# Patient Record
Sex: Female | Born: 1955 | Race: White | Hispanic: No | Marital: Single | Smoking: Never smoker
Health system: Southern US, Community
[De-identification: ages and names within clinical notes are randomized; demographics above are authoritative.]

## PROBLEM LIST (undated history)

## (undated) DIAGNOSIS — E785 Hyperlipidemia, unspecified: Secondary | ICD-10-CM

## (undated) DIAGNOSIS — Z79890 Hormone replacement therapy (postmenopausal): Secondary | ICD-10-CM

## (undated) DIAGNOSIS — F419 Anxiety disorder, unspecified: Secondary | ICD-10-CM

## (undated) DIAGNOSIS — B9681 Helicobacter pylori [H. pylori] as the cause of diseases classified elsewhere: Secondary | ICD-10-CM

## (undated) DIAGNOSIS — M8580 Other specified disorders of bone density and structure, unspecified site: Secondary | ICD-10-CM

## (undated) DIAGNOSIS — E2839 Other primary ovarian failure: Secondary | ICD-10-CM

## (undated) DIAGNOSIS — E039 Hypothyroidism, unspecified: Secondary | ICD-10-CM

## (undated) DIAGNOSIS — G479 Sleep disorder, unspecified: Secondary | ICD-10-CM

## (undated) DIAGNOSIS — G894 Chronic pain syndrome: Secondary | ICD-10-CM

## (undated) DIAGNOSIS — C50919 Malignant neoplasm of unspecified site of unspecified female breast: Secondary | ICD-10-CM

## (undated) DIAGNOSIS — K219 Gastro-esophageal reflux disease without esophagitis: Secondary | ICD-10-CM

## (undated) DIAGNOSIS — K5790 Diverticulosis of intestine, part unspecified, without perforation or abscess without bleeding: Secondary | ICD-10-CM

## (undated) DIAGNOSIS — Z973 Presence of spectacles and contact lenses: Secondary | ICD-10-CM

## (undated) DIAGNOSIS — K279 Peptic ulcer, site unspecified, unspecified as acute or chronic, without hemorrhage or perforation: Secondary | ICD-10-CM

## (undated) DIAGNOSIS — M1990 Unspecified osteoarthritis, unspecified site: Secondary | ICD-10-CM

## (undated) DIAGNOSIS — I10 Essential (primary) hypertension: Secondary | ICD-10-CM

## (undated) DIAGNOSIS — Z923 Personal history of irradiation: Secondary | ICD-10-CM

## (undated) DIAGNOSIS — F329 Major depressive disorder, single episode, unspecified: Secondary | ICD-10-CM

## (undated) DIAGNOSIS — T7840XA Allergy, unspecified, initial encounter: Secondary | ICD-10-CM

## (undated) DIAGNOSIS — F32A Depression, unspecified: Secondary | ICD-10-CM

## (undated) DIAGNOSIS — R413 Other amnesia: Secondary | ICD-10-CM

## (undated) HISTORY — DX: Chronic pain syndrome: G89.4

## (undated) HISTORY — PX: URETHRAL DILATION: SUR417

## (undated) HISTORY — PX: JOINT REPLACEMENT: SHX530

## (undated) HISTORY — DX: Gastro-esophageal reflux disease without esophagitis: K21.9

## (undated) HISTORY — DX: Sleep disorder, unspecified: G47.9

## (undated) HISTORY — DX: Helicobacter pylori (H. pylori) as the cause of diseases classified elsewhere: B96.81

## (undated) HISTORY — DX: Other specified disorders of bone density and structure, unspecified site: M85.80

## (undated) HISTORY — DX: Allergy, unspecified, initial encounter: T78.40XA

## (undated) HISTORY — DX: Hyperlipidemia, unspecified: E78.5

## (undated) HISTORY — PX: WISDOM TOOTH EXTRACTION: SHX21

## (undated) HISTORY — DX: Hormone replacement therapy: Z79.890

## (undated) HISTORY — DX: Other amnesia: R41.3

## (undated) HISTORY — PX: COLONOSCOPY: SHX174

## (undated) HISTORY — DX: Other primary ovarian failure: E28.39

## (undated) HISTORY — DX: Diverticulosis of intestine, part unspecified, without perforation or abscess without bleeding: K57.90

## (undated) HISTORY — DX: Helicobacter pylori (H. pylori) as the cause of diseases classified elsewhere: K27.9

---

## 1997-10-22 ENCOUNTER — Ambulatory Visit (HOSPITAL_COMMUNITY): Admission: RE | Admit: 1997-10-22 | Discharge: 1997-10-22 | Payer: Self-pay | Admitting: Obstetrics and Gynecology

## 1997-10-22 ENCOUNTER — Other Ambulatory Visit: Admission: RE | Admit: 1997-10-22 | Discharge: 1997-10-22 | Payer: Self-pay | Admitting: Obstetrics & Gynecology

## 2004-07-20 ENCOUNTER — Other Ambulatory Visit: Admission: RE | Admit: 2004-07-20 | Discharge: 2004-07-20 | Payer: Self-pay | Admitting: Family Medicine

## 2006-02-16 ENCOUNTER — Other Ambulatory Visit: Admission: RE | Admit: 2006-02-16 | Discharge: 2006-02-16 | Payer: Self-pay | Admitting: Family Medicine

## 2007-02-21 ENCOUNTER — Other Ambulatory Visit: Admission: RE | Admit: 2007-02-21 | Discharge: 2007-02-21 | Payer: Self-pay | Admitting: Family Medicine

## 2008-03-03 ENCOUNTER — Other Ambulatory Visit: Admission: RE | Admit: 2008-03-03 | Discharge: 2008-03-03 | Payer: Self-pay | Admitting: Obstetrics and Gynecology

## 2014-01-23 DIAGNOSIS — C50919 Malignant neoplasm of unspecified site of unspecified female breast: Secondary | ICD-10-CM

## 2014-01-23 HISTORY — DX: Malignant neoplasm of unspecified site of unspecified female breast: C50.919

## 2014-01-24 ENCOUNTER — Other Ambulatory Visit: Payer: Self-pay | Admitting: Radiology

## 2014-01-24 DIAGNOSIS — C50912 Malignant neoplasm of unspecified site of left female breast: Secondary | ICD-10-CM

## 2014-01-27 ENCOUNTER — Telehealth: Payer: Self-pay | Admitting: *Deleted

## 2014-01-27 ENCOUNTER — Ambulatory Visit
Admission: RE | Admit: 2014-01-27 | Discharge: 2014-01-27 | Disposition: A | Discharge status: Home / Self Care | Payer: BC Managed Care – PPO | Source: Ambulatory Visit | Attending: Radiology | Admitting: Radiology

## 2014-01-27 DIAGNOSIS — C50412 Malignant neoplasm of upper-outer quadrant of left female breast: Secondary | ICD-10-CM

## 2014-01-27 DIAGNOSIS — C50912 Malignant neoplasm of unspecified site of left female breast: Secondary | ICD-10-CM

## 2014-01-27 MED ORDER — GADOBENATE DIMEGLUMINE 529 MG/ML IV SOLN
15.0000 mL | Freq: Once | INTRAVENOUS | Status: AC | PRN
  Administered 2014-01-27: 15 mL via INTRAVENOUS

## 2014-01-27 NOTE — Telephone Encounter (Signed)
Left message for a return phone call to schedule patient for [facility]. Awaiting patient response. 

## 2014-01-27 NOTE — Telephone Encounter (Signed)
Received call back from patient.  Confirmed [facility] for 01/29/14 at 12N .  Instructions and contact information given.

## 2014-01-29 ENCOUNTER — Other Ambulatory Visit (INDEPENDENT_AMBULATORY_CARE_PROVIDER_SITE_OTHER): Payer: Self-pay | Admitting: General Surgery

## 2014-01-29 ENCOUNTER — Encounter: Payer: BC Managed Care – PPO | Admitting: Hematology and Oncology

## 2014-01-29 ENCOUNTER — Ambulatory Visit
Admission: RE | Admit: 2014-01-29 | Discharge: 2014-01-29 | Disposition: A | Discharge status: Home / Self Care | Payer: Managed Care, Other (non HMO) | Source: Ambulatory Visit | Attending: Radiation Oncology | Admitting: Radiation Oncology

## 2014-01-29 ENCOUNTER — Encounter: Payer: Self-pay | Admitting: Hematology and Oncology

## 2014-01-29 ENCOUNTER — Other Ambulatory Visit (HOSPITAL_BASED_OUTPATIENT_CLINIC_OR_DEPARTMENT_OTHER): Payer: BC Managed Care – PPO

## 2014-01-29 ENCOUNTER — Ambulatory Visit: Payer: BC Managed Care – PPO

## 2014-01-29 ENCOUNTER — Ambulatory Visit: Payer: BC Managed Care – PPO | Admitting: Physical Therapy

## 2014-01-29 ENCOUNTER — Ambulatory Visit (HOSPITAL_BASED_OUTPATIENT_CLINIC_OR_DEPARTMENT_OTHER): Payer: BC Managed Care – PPO | Admitting: Hematology and Oncology

## 2014-01-29 VITALS — BP 140/82 | HR 76 | Temp 98.3°F | Resp 18 | Ht 64.0 in | Wt 162.6 lb

## 2014-01-29 DIAGNOSIS — C50412 Malignant neoplasm of upper-outer quadrant of left female breast: Secondary | ICD-10-CM

## 2014-01-29 DIAGNOSIS — D0512 Intraductal carcinoma in situ of left breast: Secondary | ICD-10-CM

## 2014-01-29 DIAGNOSIS — D0590 Unspecified type of carcinoma in situ of unspecified breast: Secondary | ICD-10-CM

## 2014-01-29 DIAGNOSIS — Z170 Estrogen receptor positive status [ER+]: Secondary | ICD-10-CM

## 2014-01-29 LAB — CBC WITH DIFFERENTIAL/PLATELET
BASO%: 0.5 % (ref 0.0–2.0)
BASOS ABS: 0 10*3/uL (ref 0.0–0.1)
EOS%: 0.8 % (ref 0.0–7.0)
Eosinophils Absolute: 0.1 10*3/uL (ref 0.0–0.5)
HCT: 42.7 % (ref 34.8–46.6)
HEMOGLOBIN: 14.4 g/dL (ref 11.6–15.9)
LYMPH%: 13.9 % — ABNORMAL LOW (ref 14.0–49.7)
MCH: 33.3 pg (ref 25.1–34.0)
MCHC: 33.8 g/dL (ref 31.5–36.0)
MCV: 98.5 fL (ref 79.5–101.0)
MONO#: 0.5 10*3/uL (ref 0.1–0.9)
MONO%: 6.6 % (ref 0.0–14.0)
NEUT#: 6.5 10*3/uL (ref 1.5–6.5)
NEUT%: 78.2 % — AB (ref 38.4–76.8)
Platelets: 281 10*3/uL (ref 145–400)
RBC: 4.33 10*6/uL (ref 3.70–5.45)
RDW: 12.3 % (ref 11.2–14.5)
WBC: 8.3 10*3/uL (ref 3.9–10.3)
lymph#: 1.2 10*3/uL (ref 0.9–3.3)

## 2014-01-29 LAB — COMPREHENSIVE METABOLIC PANEL (CC13)
ALT: 46 U/L (ref 0–55)
AST: 44 U/L — AB (ref 5–34)
Albumin: 4 g/dL (ref 3.5–5.0)
Alkaline Phosphatase: 70 U/L (ref 40–150)
Anion Gap: 9 mEq/L (ref 3–11)
BUN: 6.9 mg/dL — ABNORMAL LOW (ref 7.0–26.0)
CHLORIDE: 102 meq/L (ref 98–109)
CO2: 26 mEq/L (ref 22–29)
CREATININE: 0.8 mg/dL (ref 0.6–1.1)
Calcium: 9.5 mg/dL (ref 8.4–10.4)
Glucose: 130 mg/dl (ref 70–140)
POTASSIUM: 4.3 meq/L (ref 3.5–5.1)
Sodium: 138 mEq/L (ref 136–145)
Total Bilirubin: 0.43 mg/dL (ref 0.20–1.20)
Total Protein: 7.1 g/dL (ref 6.4–8.3)

## 2014-01-29 MED ORDER — INFLUENZA VAC SPLIT QUAD 0.5 ML IM SUSY
0.5000 mL | PREFILLED_SYRINGE | Freq: Once | INTRAMUSCULAR | Status: DC
  Filled 2014-01-29: qty 0.5

## 2014-01-29 NOTE — Assessment & Plan Note (Signed)
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 The significance of ER PRreceptors was discussed and the implications for treatment in terms of antiestrogen therapy. After reviewing the pathology in detail, we proceeded to discuss the different treatment options between surgery, radiation, and antiestrogen therapies.  I would like to see the patient after surgery to discuss the final pathology report and for initiation of antiestrogen therapy. If she has any evidence of invasive cancer, we will then talk need to talk about risks and benefits of systemic chemotherapy if it was appropriate depending on the size of the tumor and lymph node status. Based on multidisciplinary tumor board discussion we determined that she does not need sentinel lymph node study is point the reassess if there was invasive cancer.

## 2014-01-29 NOTE — Progress Notes (Signed)
Checked in new patient with no financial issues prior to seeing the dr. She has appt card and [facility] packet. I gave her Lenise's card and advised of [facility] and if interested to let her know. [place] been [place] of the [place]

## 2014-01-29 NOTE — Progress Notes (Signed)
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 female who is seen today at the [facility] for the courtesy of Dr. Marlou Starks for consideration of radiation therapy in the management of her DCIS of the left breast. At the time of a screening mammogram at [facility] on 01/13/2014, she was noted to have a new cluster of heterogeneous calcifications in the left breast at 11:00. Additional views showed a 0.5 cm area of punctate calcifications in the left breast central to the nipple in the retroareolar region. Biopsy on 01/23/2014 was diagnostic for high-grade DCIS with comedonecrosis and calcifications. Breast MR on 01/27/2014 showed biopsy changes within the anterior subareolar left breast along with background enhancement limiting the evaluation of the extent of her disease. She is without complaints today.  PREVIOUS RADIATION THERAPY: No   PAST MEDICAL HISTORY:  has no past medical history on file.     PAST SURGICAL HISTORY: No past surgical history on file.   FAMILY HISTORY: family history includes Prostate cancer in her father. Her father was diagnosed with prostate cancer at age 84 and died of metastatic disease soon afterwards. Her mother is alive and has numerous orthopedic issues. No family history of breast cancer.   SOCIAL HISTORY:  reports that she has never smoked. She does not have any smokeless tobacco history on file. She reports that she drinks alcohol. She reports that she does not use illicit drugs. Single, no children. She works for [employer] in [employer].   ALLERGIES: Review of patient's allergies indicates not on file.   MEDICATIONS:  Current  Outpatient Prescriptions  Medication Sig Dispense Refill  . ALPRAZolam (XANAX) 0.5 MG tablet Take 0.5 mg by mouth at bedtime as needed for anxiety.      . Biotin 5000 MCG CAPS Take 1 capsule by mouth daily.      . celecoxib (CELEBREX) 200 MG capsule Take 200 mg by mouth 2 (two) times daily.      Marland Kitchen escitalopram (LEXAPRO) 20 MG tablet Take 20 mg by mouth daily.      . Milk Thistle 200 MG CAPS Take 100 mg by mouth 3 (three) times daily.      . nebivolol (BYSTOLIC) 10 MG tablet Take 10 mg by mouth daily.      Marland Kitchen thyroid (ARMOUR) 30 MG tablet Take 30 mg by mouth daily before breakfast.      . traMADol (ULTRAM) 50 MG tablet Take 50 mg by mouth every 6 (six) hours as needed.      . zaleplon (SONATA) 10 MG capsule Take 10 mg by mouth at bedtime as needed for sleep.       No current facility-administered medications for this encounter.   Facility-Administered Medications Ordered in Other Encounters  Medication Dose Route Frequency Provider Last Rate Last Dose  . Influenza vac split quadrivalent PF (FLUARIX) injection 0.5 mL  0.5 mL Intramuscular Once Rulon Eisenmenger, MD         REVIEW OF SYSTEMS:  Pertinent items are noted in HPI.    PHYSICAL EXAM: Alert and oriented 58 year old white female  appearing her stated age. Wt Readings from Last 3 Encounters:  01/29/14 162 lb 9.6 oz (73.755 kg)   Temp Readings from Last 3 Encounters:  01/29/14 98.3 F (36.8 C) Oral   BP Readings from Last 3 Encounters:  01/29/14 140/82   Pulse Readings from Last 3 Encounters:  01/29/14 76   Head and neck examination: Grossly unremarkable. Nodes: Without palpable cervical, supraclavicular, or axillary lymphadenopathy. Chest: Lungs clear. Breasts: There is a punctate biopsy wound at approximately 11:00 along the upper inner quadrant of the left breast. No masses are appreciated. Right breast without masses or lesions. Extremities without edema.    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 I explained to the patient that her local management options include mastectomy versus partial mastectomy followed by radiation therapy. We discussed disease-free survival and overall survival for mastectomy and breast conservation. She appears to be a candidate for breast preservation. We discussed the potential acute and late toxicities of radiation therapy. Her prognosis appears to be excellent. I can see her for a followup visit following her definitive surgery. I would obtain a baseline right breast mammogram prior to initiation of her radiation therapy to insure removal of all suspicious microcalcifications.   PLAN: As discussed above.  I spent 30 minutes minutes face to face with the patient and more than 50% of that time was spent in counseling and/or coordination of care.

## 2014-01-29 NOTE — Progress Notes (Signed)
[facility] CONSULT NOTE  Patient Care Team: Candace Wyline Copas, MD as PCP - General (Family Medicine)  CHIEF COMPLAINTS/PURPOSE OF CONSULTATION:  Newly diagnosed DCIS left breast  HISTORY OF PRESENTING ILLNESS:  Erin Barnes 58 y.o. female is here because of recent diagnosis of an left breast DCIS. Patient had a routine screening mammogram that revealed left breast calcifications that led to an ultrasound which showed only ductal ectasia. This was biopsied and it came back as high-grade DCIS ER positive PR positive with comedonecrosis. She had an MRI of the breast on 01/27/2014 that revealed a 2.1 x 1.6 x 1.6 cm abnormality some of which may be background uptake. She is here today in the multidisciplinary breast clinic to discuss the treatment plan. She is by herself. Denies any major problems or concerns. She was taking progesterone pills for menopausal symptoms for a while.  I reviewed her records extensively and collaborated the history with the patient.  SUMMARY OF ONCOLOGIC HISTORY:   Breast cancer of upper-outer quadrant of left female breast   01/27/2014 Initial Diagnosis High-grade DCIS with necrosis ER/PR positive   01/27/2014 Breast MRI Left breast subareolar region 1.6 x 1.6 x 2.1 cm    In terms of breast cancer risk profile:  She menarched at early age of 23 and went to menopause at age 22  She had 0 pregnancy,  She has not received birth control pills She was exposed to fertility medications or hormone replacement therapy.  She has no family history of Breast/GYN/GI cancer  MEDICAL HISTORY:  History reviewed. No pertinent past medical history.  SURGICAL HISTORY: History reviewed. No pertinent past surgical history.  SOCIAL HISTORY: History   Social History  . Marital Status: Single    Spouse Name: N/A    Number of Children: N/A  . Years of Education: N/A   Occupational History  . Not on file.   Social History Main Topics  . Smoking status:  Never Smoker   . Smokeless tobacco: Not on file  . Alcohol Use: Yes  . Drug Use: No  . Sexual Activity: Not on file   Other Topics Concern  . Not on file   Social History Narrative  . No narrative on file    FAMILY HISTORY: Family History  Problem Relation Age of Onset  . Prostate cancer Father     ALLERGIES:  has no allergies on file.  MEDICATIONS:  Current Outpatient Prescriptions  Medication Sig Dispense Refill  . ALPRAZolam (XANAX) 0.5 MG tablet Take 0.5 mg by mouth at bedtime as needed for anxiety.      . Biotin 5000 MCG CAPS Take 1 capsule by mouth daily.      . celecoxib (CELEBREX) 200 MG capsule Take 200 mg by mouth 2 (two) times daily.      Marland Kitchen escitalopram (LEXAPRO) 20 MG tablet Take 20 mg by mouth daily.      . Milk Thistle 200 MG CAPS Take 100 mg by mouth 3 (three) times daily.      . nebivolol (BYSTOLIC) 10 MG tablet Take 10 mg by mouth daily.      Marland Kitchen thyroid (ARMOUR) 30 MG tablet Take 30 mg by mouth daily before breakfast.      . traMADol (ULTRAM) 50 MG tablet Take 50 mg by mouth every 6 (six) hours as needed.      . zaleplon (SONATA) 10 MG capsule Take 10 mg by mouth at bedtime as needed for sleep.  No current facility-administered medications for this visit.    REVIEW OF SYSTEMS:   Constitutional: Denies fevers, chills or abnormal night sweats Eyes: Denies blurriness of vision, double vision or watery eyes Ears, nose, mouth, throat, and face: Denies mucositis or sore throat Respiratory: Denies cough, dyspnea or wheezes Cardiovascular: Denies palpitation, chest discomfort or lower extremity swelling Gastrointestinal:  Denies nausea, heartburn or change in bowel habits Skin: Denies abnormal skin rashes Lymphatics: Denies new lymphadenopathy or easy bruising Neurological:Denies numbness, tingling or new weaknesses Behavioral/Psych: Mood is stable, no new changes  Breast:  Denies any palpable lumps or discharge All other systems were reviewed with the  patient and are negative.  PHYSICAL EXAMINATION: ECOG PERFORMANCE STATUS: 0 - Asymptomatic  Filed Vitals:   01/29/14 1252  BP: 140/82  Pulse: 76  Temp: 98.3 F (36.8 C)  Resp: 18   Filed Weights   01/29/14 1252  Weight: 162 lb 9.6 oz (73.755 kg)    GENERAL:alert, no distress and comfortable SKIN: skin color, texture, turgor are normal, no rashes or significant lesions EYES: normal, conjunctiva are pink and non-injected, sclera clear OROPHARYNX:no exudate, no erythema and lips, buccal mucosa, and tongue normal  NECK: supple, thyroid normal size, non-tender, without nodularity LYMPH:  no palpable lymphadenopathy in the cervical, axillary or inguinal LUNGS: clear to auscultation and percussion with normal breathing effort HEART: regular rate & rhythm and no murmurs and no lower extremity edema ABDOMEN:abdomen soft, non-tender and normal bowel sounds Musculoskeletal:no cyanosis of digits and no clubbing  PSYCH: alert & oriented x 3 with fluent speech NEURO: no focal motor/sensory deficits BREAST: No palpable nodules in breast. No palpable axillary or supraclavicular lymphadenopathy  LABORATORY DATA:  I have reviewed the data as listed Lab Results  Component Value Date   WBC 8.3 01/29/2014   HGB 14.4 01/29/2014   HCT 42.7 01/29/2014   MCV 98.5 01/29/2014   PLT 281 01/29/2014   Lab Results  Component Value Date   NA 138 01/29/2014   K 4.3 01/29/2014   CO2 26 01/29/2014    RADIOGRAPHIC STUDIES: I have personally reviewed the radiological reports and agreed with the findings in the report. 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 The significance of ER PRreceptors was  discussed and the implications for treatment in terms of antiestrogen therapy. After reviewing the pathology in detail, we proceeded to discuss the different treatment options between surgery, radiation, and antiestrogen therapies.  I would like to see the patient after surgery to discuss the final pathology report and for initiation of antiestrogen therapy. If she has any evidence of invasive cancer, we will then talk need to talk about risks and benefits of systemic chemotherapy if it was appropriate depending on the size of the tumor and lymph node status. Based on multidisciplinary tumor board discussion we determined that she does not need sentinel lymph node study is point the reassess if there was invasive cancer.   return to clinic after surgery to start antiestrogen therapy and to review pathology.  All questions were answered. The patient knows to call the clinic with any problems, questions or concerns. I spent 55 minutes counseling the patient face to face. The total time spent in the  appointment was 60 minutes and more than 50% was on counseling.     Rulon Eisenmenger, MD 01/29/2014 4:02 PM   Her

## 2014-01-31 ENCOUNTER — Encounter: Payer: Self-pay | Admitting: General Practice

## 2014-01-31 NOTE — Progress Notes (Signed)
[place] Psychosocial Distress Screening Spiritual Care  With Counseling Intern, visited with Ms Alber at breast clinic to introduce [place], Counseling, and [place] resources, and to review distress screening protocol.  The patient scored a 1 on the Psychosocial Distress Thermometer which indicates mild distress. Chaplain and Counseling Intern assessed for distress and other psychosocial needs.   [facility] DISTRESS SCREENING 01/31/2014  Screening Type Initial Screening  Mark the number that describes how much distress you have been experiencing in the past week 0  Referral to support programs Yes  Other Spiritual Care, Counseling Intern   Ms Earnhardt was very upbeat, using faith as a central coping tool:  "I just know whose I am...and I'm here for a reason; my destiny's not up."  She rates her distress as "0," citing "no fear."  My only concern is if tx becomes more complicated that she expects:  How will she cope if she becomes fearful or disheartened?  Please page Spiritual Care if such emotional/spiritual needs arise during course of tx:  [phone].  Thank you.   Immediate follow up needed: No.   plans to follow up by phone later in pt's course of tx.  [place], [place]

## 2014-01-31 NOTE — Progress Notes (Signed)
Note created by MD during office visit - copy to pt, original to scan.   

## 2014-02-04 ENCOUNTER — Telehealth: Payer: Self-pay | Admitting: *Deleted

## 2014-02-04 NOTE — Telephone Encounter (Signed)
Left message for a return phone call from [facility]. Awaiting patient response.

## 2014-02-05 ENCOUNTER — Other Ambulatory Visit: Payer: Self-pay

## 2014-02-07 ENCOUNTER — Encounter (HOSPITAL_BASED_OUTPATIENT_CLINIC_OR_DEPARTMENT_OTHER): Payer: Self-pay | Admitting: *Deleted

## 2014-02-07 ENCOUNTER — Telehealth: Payer: Self-pay | Admitting: Hematology and Oncology

## 2014-02-07 NOTE — Telephone Encounter (Signed)
lvm forpt regarding to OCT appt.....amiled pt appt sched/avs and letter

## 2014-02-07 NOTE — Progress Notes (Signed)
To come in for ekg-had labs 01/29/14

## 2014-02-10 ENCOUNTER — Encounter (HOSPITAL_BASED_OUTPATIENT_CLINIC_OR_DEPARTMENT_OTHER)
Admission: RE | Admit: 2014-02-10 | Discharge: 2014-02-10 | Disposition: A | Discharge status: Home / Self Care | Payer: BC Managed Care – PPO | Source: Ambulatory Visit | Attending: General Surgery | Admitting: General Surgery

## 2014-02-10 DIAGNOSIS — E079 Disorder of thyroid, unspecified: Secondary | ICD-10-CM | POA: Diagnosis not present

## 2014-02-10 DIAGNOSIS — M129 Arthropathy, unspecified: Secondary | ICD-10-CM | POA: Diagnosis not present

## 2014-02-10 DIAGNOSIS — Z01812 Encounter for preprocedural laboratory examination: Secondary | ICD-10-CM | POA: Insufficient documentation

## 2014-02-10 DIAGNOSIS — Z01810 Encounter for preprocedural cardiovascular examination: Secondary | ICD-10-CM | POA: Diagnosis present

## 2014-02-10 DIAGNOSIS — I10 Essential (primary) hypertension: Secondary | ICD-10-CM | POA: Diagnosis not present

## 2014-02-10 DIAGNOSIS — D0590 Unspecified type of carcinoma in situ of unspecified breast: Secondary | ICD-10-CM | POA: Insufficient documentation

## 2014-02-10 DIAGNOSIS — C50919 Malignant neoplasm of unspecified site of unspecified female breast: Secondary | ICD-10-CM | POA: Diagnosis present

## 2014-02-10 NOTE — Progress Notes (Signed)
EKG reviewed by Dr Al Corpus.

## 2014-02-12 ENCOUNTER — Encounter (HOSPITAL_BASED_OUTPATIENT_CLINIC_OR_DEPARTMENT_OTHER)
Admission: RE | Disposition: A | Discharge status: Home / Self Care | Payer: Self-pay | Source: Ambulatory Visit | Attending: General Surgery

## 2014-02-12 ENCOUNTER — Ambulatory Visit (HOSPITAL_BASED_OUTPATIENT_CLINIC_OR_DEPARTMENT_OTHER): Payer: BC Managed Care – PPO | Admitting: Anesthesiology

## 2014-02-12 ENCOUNTER — Encounter (HOSPITAL_BASED_OUTPATIENT_CLINIC_OR_DEPARTMENT_OTHER): Payer: Self-pay

## 2014-02-12 ENCOUNTER — Encounter (HOSPITAL_BASED_OUTPATIENT_CLINIC_OR_DEPARTMENT_OTHER): Payer: BC Managed Care – PPO | Admitting: Anesthesiology

## 2014-02-12 ENCOUNTER — Ambulatory Visit (HOSPITAL_BASED_OUTPATIENT_CLINIC_OR_DEPARTMENT_OTHER)
Admission: RE | Admit: 2014-02-12 | Discharge: 2014-02-12 | Disposition: A | Discharge status: Home / Self Care | Payer: BC Managed Care – PPO | Source: Ambulatory Visit | Attending: General Surgery | Admitting: General Surgery

## 2014-02-12 DIAGNOSIS — E079 Disorder of thyroid, unspecified: Secondary | ICD-10-CM | POA: Insufficient documentation

## 2014-02-12 DIAGNOSIS — M129 Arthropathy, unspecified: Secondary | ICD-10-CM | POA: Insufficient documentation

## 2014-02-12 DIAGNOSIS — C50919 Malignant neoplasm of unspecified site of unspecified female breast: Secondary | ICD-10-CM | POA: Diagnosis not present

## 2014-02-12 DIAGNOSIS — C50412 Malignant neoplasm of upper-outer quadrant of left female breast: Secondary | ICD-10-CM

## 2014-02-12 DIAGNOSIS — I10 Essential (primary) hypertension: Secondary | ICD-10-CM | POA: Insufficient documentation

## 2014-02-12 HISTORY — DX: Hypothyroidism, unspecified: E03.9

## 2014-02-12 HISTORY — DX: Anxiety disorder, unspecified: F41.9

## 2014-02-12 HISTORY — DX: Presence of spectacles and contact lenses: Z97.3

## 2014-02-12 HISTORY — DX: Depression, unspecified: F32.A

## 2014-02-12 HISTORY — DX: Unspecified osteoarthritis, unspecified site: M19.90

## 2014-02-12 HISTORY — PX: BREAST LUMPECTOMY WITH RADIOACTIVE SEED LOCALIZATION: SHX6424

## 2014-02-12 HISTORY — DX: Essential (primary) hypertension: I10

## 2014-02-12 HISTORY — DX: Major depressive disorder, single episode, unspecified: F32.9

## 2014-02-12 SURGERY — BREAST LUMPECTOMY WITH RADIOACTIVE SEED LOCALIZATION
Anesthesia: General | Site: Breast | Laterality: Left

## 2014-02-12 MED ORDER — FENTANYL CITRATE 0.05 MG/ML IJ SOLN
INTRAMUSCULAR | Status: AC
  Filled 2014-02-12: qty 4

## 2014-02-12 MED ORDER — SCOPOLAMINE 1 MG/3DAYS TD PT72
1.0000 | MEDICATED_PATCH | TRANSDERMAL | Status: DC
  Administered 2014-02-12: 1.5 mg via TRANSDERMAL

## 2014-02-12 MED ORDER — CHLORHEXIDINE GLUCONATE 4 % EX LIQD: 1.0000 "application " | Freq: Once | CUTANEOUS | Status: DC

## 2014-02-12 MED ORDER — ONDANSETRON HCL 4 MG/2ML IJ SOLN: 4.0000 mg | Freq: Once | INTRAMUSCULAR | Status: DC | PRN

## 2014-02-12 MED ORDER — ONDANSETRON HCL 4 MG/2ML IJ SOLN
INTRAMUSCULAR | Status: DC | PRN
  Administered 2014-02-12: 4 mg via INTRAVENOUS

## 2014-02-12 MED ORDER — MIDAZOLAM HCL 2 MG/2ML IJ SOLN
INTRAMUSCULAR | Status: AC
  Filled 2014-02-12: qty 2

## 2014-02-12 MED ORDER — OXYCODONE HCL 5 MG PO TABS: 5.0000 mg | ORAL_TABLET | Freq: Once | ORAL | Status: DC | PRN

## 2014-02-12 MED ORDER — OXYCODONE-ACETAMINOPHEN 5-325 MG PO TABS: 1.0000 | ORAL_TABLET | ORAL | Status: DC | PRN

## 2014-02-12 MED ORDER — LIDOCAINE HCL (CARDIAC) 20 MG/ML IV SOLN
INTRAVENOUS | Status: DC | PRN
  Administered 2014-02-12: 80 mg via INTRAVENOUS

## 2014-02-12 MED ORDER — FENTANYL CITRATE 0.05 MG/ML IJ SOLN
INTRAMUSCULAR | Status: DC | PRN
  Administered 2014-02-12: 50 ug via INTRAVENOUS
  Administered 2014-02-12 (×2): 25 ug via INTRAVENOUS
  Administered 2014-02-12: 100 ug via INTRAVENOUS

## 2014-02-12 MED ORDER — MIDAZOLAM HCL 2 MG/2ML IJ SOLN: 1.0000 mg | INTRAMUSCULAR | Status: DC | PRN

## 2014-02-12 MED ORDER — PROPOFOL 10 MG/ML IV BOLUS
INTRAVENOUS | Status: AC
  Filled 2014-02-12: qty 20

## 2014-02-12 MED ORDER — EPHEDRINE SULFATE 50 MG/ML IJ SOLN
INTRAMUSCULAR | Status: DC | PRN
  Administered 2014-02-12 (×2): 10 mg via INTRAVENOUS

## 2014-02-12 MED ORDER — SCOPOLAMINE 1 MG/3DAYS TD PT72
MEDICATED_PATCH | TRANSDERMAL | Status: AC
Start: 2014-02-12 — End: 2014-02-12
  Filled 2014-02-12: qty 1

## 2014-02-12 MED ORDER — BUPIVACAINE-EPINEPHRINE (PF) 0.25% -1:200000 IJ SOLN
INTRAMUSCULAR | Status: AC
  Filled 2014-02-12: qty 30

## 2014-02-12 MED ORDER — HYDROMORPHONE HCL 1 MG/ML IJ SOLN
INTRAMUSCULAR | Status: AC
  Filled 2014-02-12: qty 1

## 2014-02-12 MED ORDER — FENTANYL CITRATE 0.05 MG/ML IJ SOLN: 50.0000 ug | INTRAMUSCULAR | Status: DC | PRN

## 2014-02-12 MED ORDER — PROPOFOL 10 MG/ML IV BOLUS
INTRAVENOUS | Status: DC | PRN
  Administered 2014-02-12: 200 mg via INTRAVENOUS

## 2014-02-12 MED ORDER — BUPIVACAINE HCL (PF) 0.25 % IJ SOLN
INTRAMUSCULAR | Status: AC
  Filled 2014-02-12: qty 30

## 2014-02-12 MED ORDER — MIDAZOLAM HCL 5 MG/5ML IJ SOLN
INTRAMUSCULAR | Status: DC | PRN
  Administered 2014-02-12: 2 mg via INTRAVENOUS

## 2014-02-12 MED ORDER — OXYCODONE HCL 5 MG/5ML PO SOLN: 5.0000 mg | Freq: Once | ORAL | Status: DC | PRN

## 2014-02-12 MED ORDER — BUPIVACAINE HCL (PF) 0.25 % IJ SOLN
INTRAMUSCULAR | Status: DC | PRN
  Administered 2014-02-12: 20 mL

## 2014-02-12 MED ORDER — HYDROMORPHONE HCL PF 1 MG/ML IJ SOLN
0.2500 mg | INTRAMUSCULAR | Status: DC | PRN
  Administered 2014-02-12 (×2): 0.25 mg via INTRAVENOUS
  Administered 2014-02-12: 0.5 mg via INTRAVENOUS

## 2014-02-12 MED ORDER — CEFAZOLIN SODIUM-DEXTROSE 2-3 GM-% IV SOLR
INTRAVENOUS | Status: AC
  Filled 2014-02-12: qty 50

## 2014-02-12 MED ORDER — CEFAZOLIN SODIUM-DEXTROSE 2-3 GM-% IV SOLR
2.0000 g | INTRAVENOUS | Status: AC
  Administered 2014-02-12: 2 g via INTRAVENOUS

## 2014-02-12 MED ORDER — LACTATED RINGERS IV SOLN
INTRAVENOUS | Status: DC
  Administered 2014-02-12 (×2): via INTRAVENOUS

## 2014-02-12 MED ORDER — DEXAMETHASONE SODIUM PHOSPHATE 4 MG/ML IJ SOLN
INTRAMUSCULAR | Status: DC | PRN
  Administered 2014-02-12: 10 mg via INTRAVENOUS

## 2014-02-12 SURGICAL SUPPLY — 46 items
APPLIER CLIP 9.375 MED OPEN (MISCELLANEOUS) ×3
BINDER BREAST LRG (GAUZE/BANDAGES/DRESSINGS) IMPLANT
BINDER BREAST MEDIUM (GAUZE/BANDAGES/DRESSINGS) IMPLANT
BINDER BREAST XLRG (GAUZE/BANDAGES/DRESSINGS) IMPLANT
BINDER BREAST XXLRG (GAUZE/BANDAGES/DRESSINGS) IMPLANT
BLADE SURG 15 STRL LF DISP TIS (BLADE) ×1 IMPLANT
BLADE SURG 15 STRL SS (BLADE) ×2
CANISTER SUC SOCK COL 7IN (MISCELLANEOUS) ×3 IMPLANT
CANISTER SUCT 1200ML W/VALVE (MISCELLANEOUS) ×3 IMPLANT
CHLORAPREP W/TINT 26ML (MISCELLANEOUS) ×3 IMPLANT
CLIP APPLIE 9.375 MED OPEN (MISCELLANEOUS) ×1 IMPLANT
CLIP TI WIDE RED SMALL 6 (CLIP) IMPLANT
COVER MAYO STAND STRL (DRAPES) ×3 IMPLANT
COVER PROBE W GEL 5X96 (DRAPES) ×3 IMPLANT
COVER TABLE BACK 60X90 (DRAPES) ×3 IMPLANT
DECANTER SPIKE VIAL GLASS SM (MISCELLANEOUS) ×3 IMPLANT
DERMABOND ADVANCED (GAUZE/BANDAGES/DRESSINGS) ×2
DERMABOND ADVANCED .7 DNX12 (GAUZE/BANDAGES/DRESSINGS) ×1 IMPLANT
DEVICE DUBIN W/COMP PLATE 8390 (MISCELLANEOUS) ×3 IMPLANT
DRAPE LAPAROSCOPIC ABDOMINAL (DRAPES) IMPLANT
DRAPE UTILITY XL STRL (DRAPES) ×3 IMPLANT
ELECT COATED BLADE 2.86 ST (ELECTRODE) ×3 IMPLANT
ELECT REM PT RETURN 9FT ADLT (ELECTROSURGICAL) ×3
ELECTRODE REM PT RTRN 9FT ADLT (ELECTROSURGICAL) ×1 IMPLANT
GLOVE BIO SURGEON STRL SZ7.5 (GLOVE) ×3 IMPLANT
GLOVE BIOGEL PI IND STRL 7.0 (GLOVE) ×2 IMPLANT
GLOVE BIOGEL PI INDICATOR 7.0 (GLOVE) ×4
GLOVE ECLIPSE 7.0 STRL STRAW (GLOVE) ×3 IMPLANT
GOWN STRL REUS W/ TWL LRG LVL3 (GOWN DISPOSABLE) ×2 IMPLANT
GOWN STRL REUS W/TWL LRG LVL3 (GOWN DISPOSABLE) ×4
KIT MARKER MARGIN INK (KITS) ×3 IMPLANT
NEEDLE HYPO 25X1 1.5 SAFETY (NEEDLE) ×3 IMPLANT
NS IRRIG 1000ML POUR BTL (IV SOLUTION) ×3 IMPLANT
PACK BASIN DAY SURGERY FS (CUSTOM PROCEDURE TRAY) ×3 IMPLANT
PENCIL BUTTON HOLSTER BLD 10FT (ELECTRODE) ×3 IMPLANT
SLEEVE SCD COMPRESS KNEE MED (MISCELLANEOUS) ×3 IMPLANT
SPONGE LAP 18X18 X RAY DECT (DISPOSABLE) ×3 IMPLANT
SUT MON AB 4-0 PC3 18 (SUTURE) ×3 IMPLANT
SUT SILK 2 0 SH (SUTURE) IMPLANT
SUT VICRYL 3-0 CR8 SH (SUTURE) ×3 IMPLANT
SYR CONTROL 10ML LL (SYRINGE) ×3 IMPLANT
TOWEL OR 17X24 6PK STRL BLUE (TOWEL DISPOSABLE) ×3 IMPLANT
TOWEL OR NON WOVEN STRL DISP B (DISPOSABLE) ×3 IMPLANT
TUBE CONNECTING 20'X1/4 (TUBING)
TUBE CONNECTING 20X1/4 (TUBING) IMPLANT
YANKAUER SUCT BULB TIP NO VENT (SUCTIONS) IMPLANT

## 2014-02-12 NOTE — Anesthesia Procedure Notes (Signed)
Procedure Name: LMA Insertion Date/Time: 02/12/2014 10:14 AM Performed by: Maryella Shivers Pre-anesthesia Checklist: Patient identified, Emergency Drugs available, Suction available and Patient being monitored Patient Re-evaluated:Patient Re-evaluated prior to inductionOxygen Delivery Method: Circle System Utilized Preoxygenation: Pre-oxygenation with 100% oxygen Intubation Type: IV induction Ventilation: Mask ventilation without difficulty LMA: LMA inserted LMA Size: 4.0 Number of attempts: 1 Airway Equipment and Method: bite block Placement Confirmation: positive ETCO2 Tube secured with: Tape Dental Injury: Teeth and Oropharynx as per pre-operative assessment

## 2014-02-12 NOTE — Interval H&P Note (Signed)
History and Physical Interval Note:  02/12/2014 10:01 AM  Erin Barnes  has presented today for surgery, with the diagnosis of left breast dcis  The various methods of treatment have been discussed with the patient and family. After consideration of risks, benefits and other options for treatment, the patient has consented to  Procedure(s): LEFT BREAST RADIOACTIVE SEED LOCALIZATION LUMPECTOMY  (Left) as a surgical intervention .  The patient's history has been reviewed, patient examined, no change in status, stable for surgery.  I have reviewed the patient's chart and labs.  Questions were answered to the patient's satisfaction.     TOTH III,Idriss Quackenbush S

## 2014-02-12 NOTE — Transfer of Care (Signed)
Immediate Anesthesia Transfer of Care Note  Patient: Erin Barnes  Procedure(s) Performed: Procedure(s): LEFT BREAST RADIOACTIVE SEED LOCALIZATION LUMPECTOMY  (Left)  Patient Location: PACU  Anesthesia Type:General  Level of Consciousness: awake, alert  and oriented  Airway & Oxygen Therapy: Patient Spontanous Breathing and Patient connected to face mask oxygen  Post-op Assessment: Report given to PACU RN and Post -op Vital signs reviewed and stable  Post vital signs: Reviewed and stable  Complications: No apparent anesthesia complications

## 2014-02-12 NOTE — H&P (Signed)
Erin Barnes 01/29/2014 10:35 AM Location: [facility] Patient #: [id number] DOB: 26-Oct-1955 Undefined / Language: Suszanne Conners / Race: Undefined Female  History of Present Illness Sammuel Hines. Marlou Starks MD; 01/29/2014 4:51 PM) The patient is a 58 year old female who presents with breast cancer. The pt presents after a screening mammogram showed a small area of abnormal calcification in the upper outer quadrant of the left breast near the areola. 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 It was ER and PR +. MRI estimated the size to be 2.1cm. she denied any breast pain or discharge. she is taking female hormones and I have advised her to stop these   Other Problems Ivor Costa, [place]; 01/29/2014 10:35 AM) Arthritis Breast Cancer High blood pressure Thyroid Disease  Past Surgical History Ivor [place], [place]; 01/29/2014 10:35 AM) No pertinent past surgical history  Diagnostic Studies History Ivor [place], [place]; 01/29/2014 10:35 AM) Colonoscopy 5-10 years ago Mammogram within last year Pap Smear 1-5 years ago  Social History Ivor [place], [place]; 01/29/2014 10:35 AM) Alcohol use Moderate alcohol use. Caffeine use Coffee. No drug use Tobacco use Never smoker.  Family History Ivor Costa, [place]; 01/29/2014 10:35 AM) Depression Mother. Prostate Cancer Father.  Pregnancy / Birth History Ivor Costa, [place]; 01/29/2014 10:35 AM) Age at menarche 40 years. Age of menopause 56-55 Gravida 0 Para 0 Regular periods  Review of Systems Alyse Low [place]; 01/29/2014 10:35 AM) General Present- Weight Gain. Not Present- Appetite Loss, Chills, Fatigue, Fever, Night Sweats and Weight Loss. Skin Not Present- Change in Wart/Mole, Dryness, Hives, Jaundice, New Lesions, Non-Healing Wounds, Rash and Ulcer. HEENT Present- Wears glasses/contact lenses. Not Present- Earache, Hearing Loss, Hoarseness, Nose Bleed, Oral Ulcers, Ringing in the Ears, Seasonal Allergies, Sinus Pain, Sore Throat,  Visual Disturbances and Yellow Eyes. Respiratory Not Present- Bloody sputum, Chronic Cough, Difficulty Breathing, Snoring and Wheezing. Breast Present- Breast Mass. Not Present- Breast Pain, Nipple Discharge and Skin Changes. Cardiovascular Not Present- Chest Pain, Difficulty Breathing Lying Down, Leg Cramps, Palpitations, Rapid Heart Rate, Shortness of Breath and Swelling of Extremities. Gastrointestinal Not Present- Abdominal Pain, Bloating, Bloody Stool, Change in Bowel Habits, Chronic diarrhea, Constipation, Difficulty Swallowing, Excessive gas, Gets full quickly at meals, Hemorrhoids, Indigestion, Nausea, Rectal Pain and Vomiting. Female Genitourinary Not Present- Frequency, Nocturia, Painful Urination, Pelvic Pain and Urgency. Musculoskeletal Present- Back Pain. Not Present- Joint Pain, Joint Stiffness, Muscle Pain, Muscle Weakness and Swelling of Extremities. Neurological Not Present- Decreased Memory, Fainting, Headaches, Numbness, Seizures, Tingling, Tremor, Trouble walking and Weakness. Psychiatric Present- Anxiety and Depression. Not Present- Bipolar, Change in Sleep Pattern, Fearful and Frequent crying. Endocrine Not Present- Cold Intolerance, Excessive Hunger, Hair Changes, Heat Intolerance, Hot flashes and New Diabetes. Hematology Not Present- Easy Bruising, Excessive bleeding, Gland problems, HIV and Persistent Infections.   Physical Exam Eddie Dibbles S. Marlou Starks MD; 01/29/2014 4:53 PM) General Mental Status-Alert. General Appearance-Consistent with stated age. Hydration-Well hydrated. Voice-Normal.  Head and Neck Head-normocephalic, atraumatic with no lesions or palpable masses. Trachea-midline. Thyroid Gland Characteristics - normal size and consistency.  Eye Eyeball - Bilateral-Extraocular movements intact. Sclera/Conjunctiva - Bilateral-No scleral icterus.  Chest and Lung Exam Chest and lung exam reveals -quiet, even and easy respiratory effort with no use of  accessory muscles, normal resonance, no flatness or dullness, non-tender and normal tactile fremitus and on auscultation, normal breath sounds, no adventitious sounds and normal vocal resonance. Inspection Chest Wall - Normal. Back - normal.  Breast Breast - Left-Symmetric, Non Tender, No Biopsy scars, no Dimpling, No  Inflammation, No Lumpectomy scars, No Mastectomy scars, No Peau d' Orange. Breast - Right-Symmetric, Non Tender, No Biopsy scars, no Dimpling, No Inflammation, No Lumpectomy scars, No Mastectomy scars, No Peau d' Orange. Breast - Bilateral -Note: There is no palpable mass in either breast. There is no palpable axillary, supraclavicular, or cervical lymphadenopathy.   Cardiovascular Cardiovascular examination reveals -on palpation PMI is normal in location and amplitude, no palpable S3 or S4. Normal cardiac borders., normal heart sounds, regular rate and rhythm with no murmurs, carotid auscultation reveals no bruits and normal pedal pulses bilaterally.  Abdomen Inspection Inspection of the abdomen reveals - No Hernias. Skin - Scar - no surgical scars. Palpation/Percussion Palpation and Percussion of the abdomen reveal - Soft, Non Tender, No Rebound tenderness, No Rigidity (guarding) and No hepatosplenomegaly. Auscultation Auscultation of the abdomen reveals - Bowel sounds normal.  Peripheral Vascular Upper Extremity Inspection - Bilateral - Normal - No Clubbing, No Cyanosis, No Edema, Pulses Intact. Palpation - Pulses bilaterally normal. Lower Extremity Palpation - Pulses bilaterally normal.  Neurologic Neurologic evaluation reveals -alert and oriented x 3 with no impairment of recent or remote memory. Mental Status-Normal.  Musculoskeletal Normal Exam - Left-Upper Extremity Strength Normal and Lower Extremity Strength Normal. Normal Exam - Right-Upper Extremity Strength Normal, Lower Extremity Weakness.  Lymphatic Head & Neck  General Head & Neck  Lymphatics: Bilateral - Description - Normal. Axillary  General Axillary Region: Bilateral - Description - Normal. Tenderness - Non Tender. Femoral & Inguinal  Generalized Femoral & Inguinal Lymphatics: Bilateral - Description - Normal. Tenderness - Non Tender.    Assessment & Plan Eddie Dibbles S. 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 I have discussed with her the different options for surgery and she favors breast conservation which is reasonable. Given the small size and location she has chosen not to have the sentinel node biopsy done at that time. I have discussed with her the risks and benefits of surgery as well as some of the technical aspects and she understands and wishes to proceed. She will discontinue her hormone replacement immediately     Signed by Luella Cook, MD (01/29/2014 4:59 PM)

## 2014-02-12 NOTE — Discharge Instructions (Signed)

## 2014-02-12 NOTE — Anesthesia Postprocedure Evaluation (Signed)
  Anesthesia Post-op Note  Patient: Erin Barnes  Procedure(s) Performed: Procedure(s): LEFT BREAST RADIOACTIVE SEED LOCALIZATION LUMPECTOMY  (Left)  Patient Location: PACU  Anesthesia Type: General   Level of Consciousness: awake, alert  and oriented  Airway and Oxygen Therapy: Patient Spontanous Breathing  Post-op Pain: mild  Post-op Assessment: Post-op Vital signs reviewed  Post-op Vital Signs: Reviewed  Last Vitals:  Filed Vitals:   02/12/14 1200  BP: 131/78  Pulse: 72  Temp:   Resp: 15    Complications: No apparent anesthesia complications

## 2014-02-12 NOTE — Anesthesia Preprocedure Evaluation (Signed)
Anesthesia Evaluation  Patient identified by MRN, date of birth, ID band Patient awake    Airway Mallampati: I TM Distance: >3 FB Neck ROM: Full    Dental  (+) Teeth Intact, Dental Advisory Given   Pulmonary  breath sounds clear to auscultation        Cardiovascular hypertension, Pt. on medications and Pt. on home beta blockers Rhythm:Regular Rate:Normal     Neuro/Psych    GI/Hepatic   Endo/Other    Renal/GU      Musculoskeletal   Abdominal   Peds  Hematology   Anesthesia Other Findings   Reproductive/Obstetrics                           Anesthesia Physical Anesthesia Plan  ASA: II  Anesthesia Plan: General   Post-op Pain Management:    Induction: Intravenous  Airway Management Planned: LMA  Additional Equipment:   Intra-op Plan:   Post-operative Plan: Extubation in OR  Informed Consent: I have reviewed the patients History and Physical, chart, labs and discussed the procedure including the risks, benefits and alternatives for the proposed anesthesia with the patient or authorized representative who has indicated his/her understanding and acceptance.   Dental advisory given  Plan Discussed with: CRNA, Anesthesiologist and Surgeon  Anesthesia Plan Comments:         Anesthesia Quick Evaluation

## 2014-02-12 NOTE — Op Note (Addendum)
02/12/2014  11:12 AM  PATIENT:  Erin Barnes  58 y.o. female  PRE-OPERATIVE DIAGNOSIS:  left breast dcis  POST-OPERATIVE DIAGNOSIS:  left breast dcis  PROCEDURE:  Procedure(s): LEFT BREAST RADIOACTIVE SEED LOCALIZATION LUMPECTOMY  (Left)  SURGEON:  Surgeon(s) and Role:    * Jovita Kussmaul, MD - Primary  PHYSICIAN ASSISTANT:   ASSISTANTS: none   ANESTHESIA:   general  EBL:  Total I/O In: 1000 [I.V.:1000] Out: -   BLOOD ADMINISTERED:none  DRAINS: none   LOCAL MEDICATIONS USED:  MARCAINE     SPECIMEN:  Source of Specimen:  left breast tissue  DISPOSITION OF SPECIMEN:  PATHOLOGY  COUNTS:  YES  TOURNIQUET:  * No tourniquets in log *  DICTATION: .Dragon Dictation After informed consent was obtained the patient was brought to the operating room and placed in the supine position on the operating room table. After adequate induction of general anesthesia the patient's left breast was prepped with ChloraPrep, wet-to-dry, and draped in usual sterile manner. Previously the patient had a radioactive seed placement in the upper inner quadrant of the left breast to mark an area of DCIS. The radioactive duty was confirmed in the upper inner quadrant of the left breast near the nipple. A curvilinear incision was made along the upper areola with a 15 blade knife. This incision was carried through the skin and subcutaneous tissue sharply with the electrocautery. The neoprobe set to I-125 was used to localize the area. A circular portion of breast tissue was excised sharply with the electrocautery around the area of radioactivity. Once the specimen was removed the radioactivity was confirmed in the lumpectomy specimen. There was no residual radioactivity left in the left breast. The specimen was then oriented with the appropriate paint colors. A specimen radiograph was obtained that showed the clip, calcifications, and seed to be in the center of the specimen. The specimen was then sent to  pathology for further evaluation. A small portion of the anterior superior and anterior inferior margins were excised sharply with Metzenbaum scissors and the specimens were sent separately. Hemostasis was achieved using the Bovie electrocautery. The edges of the lumpectomy cavity were then marked with clips. The wound was irrigated with saline and infiltrated with quarter percent Marcaine. The deep layer of the wound was then closed with interrupted 3-0 Vicryl stitches. The skin was then closed with interrupted 4-0 Monocryl subcuticular stitches. Dermabond dressings were applied. The patient tolerated the procedure well. At the end of the case all needle sponge and instrument counts were correct. The patient was then awakened and taken to recovery in stable condition.  PLAN OF CARE: Discharge to home after PACU  PATIENT DISPOSITION:  PACU - hemodynamically stable.   Delay start of Pharmacological VTE agent (>24hrs) due to surgical blood loss or risk of bleeding: not applicable

## 2014-02-13 ENCOUNTER — Encounter (HOSPITAL_BASED_OUTPATIENT_CLINIC_OR_DEPARTMENT_OTHER): Payer: Self-pay | Admitting: General Surgery

## 2014-02-20 ENCOUNTER — Encounter: Payer: Self-pay | Admitting: *Deleted

## 2014-02-20 NOTE — Progress Notes (Signed)
Received mammography report from [facility]. Sent to scan.

## 2014-02-26 ENCOUNTER — Encounter: Payer: Self-pay | Admitting: Radiation Oncology

## 2014-02-26 NOTE — Progress Notes (Signed)
Location of Breast Cancer:  Left upper outer  Histology per Pathology Report:  02/12/14 Diagnosis 1. Breast, lumpectomy, left - DUCTAL CARCINOMA IN SITU WITH CALCIFICATIONS, HIGH GRADE, SPANNING 2.5 CM. - THE SURGICAL RESECTION MARGINS ARE NEGATIVE FOR CARCINOMA. - SEE ONCOLOGY TABLE BELOW. 2. Breast, excision, anterior superior - BENIGN FIBROADIPOSE TISSUE WITH FAT NECROSIS. - THERE IS NO EVIDENCE OF MALIGNANCY. - SEE COMMENT. 3. Breast, excision, anterior inferior - BENIGN BREAST PARENCHYMA. - THERE IS NO EVIDENCE OF MALIGNANCY. - SEE COMMENT.  Receptor Status: ER(100%), PR (90%), Her2-neu ()  Did patient present with symptoms (if so, please note symptoms) or was this found on screening mammography?: screening mammogram  Past/Anticipated interventions by surgeon, if any: left lumpectomy, Dr Marlou Starks    Past/Anticipated interventions by medical oncology, if any: Chemotherapy  Dr Lindi Adie: pt to return after surgery to discuss chemotherapy treatments, scheduled on 02/27/14  Lymphedema issues, if any:  no  Pain issues, if any:  no  SAFETY ISSUES:  Prior radiation? no  Pacemaker/ICD? no  Possible current pregnancy? no  Is the patient on methotrexate? no  Current Complaints / other details:  Single, no children, works for [employer] in [employer] [place] Menarche age 62, P0, no family hx breast cancer    Jacobo Forest, Verneita Griffes, RN 02/26/2014,3:49 PM

## 2014-02-27 ENCOUNTER — Ambulatory Visit (HOSPITAL_BASED_OUTPATIENT_CLINIC_OR_DEPARTMENT_OTHER): Payer: BC Managed Care – PPO | Admitting: Hematology and Oncology

## 2014-02-27 ENCOUNTER — Encounter: Payer: Self-pay | Admitting: Radiation Oncology

## 2014-02-27 ENCOUNTER — Ambulatory Visit
Admission: RE | Admit: 2014-02-27 | Discharge: 2014-02-27 | Disposition: A | Discharge status: Home / Self Care | Payer: BC Managed Care – PPO | Source: Ambulatory Visit | Attending: Radiation Oncology | Admitting: Radiation Oncology

## 2014-02-27 ENCOUNTER — Telehealth: Payer: Self-pay | Admitting: Hematology and Oncology

## 2014-02-27 VITALS — BP 122/87 | HR 72 | Temp 98.3°F | Resp 20 | Wt 166.0 lb

## 2014-02-27 VITALS — BP 135/88 | HR 70 | Temp 98.2°F | Resp 18 | Ht 64.0 in | Wt 167.8 lb

## 2014-02-27 DIAGNOSIS — Z510 Encounter for antineoplastic radiation therapy: Secondary | ICD-10-CM | POA: Insufficient documentation

## 2014-02-27 DIAGNOSIS — C50412 Malignant neoplasm of upper-outer quadrant of left female breast: Secondary | ICD-10-CM | POA: Diagnosis not present

## 2014-02-27 DIAGNOSIS — C50112 Malignant neoplasm of central portion of left female breast: Secondary | ICD-10-CM

## 2014-02-27 DIAGNOSIS — D0592 Unspecified type of carcinoma in situ of left breast: Secondary | ICD-10-CM

## 2014-02-27 DIAGNOSIS — Z170 Estrogen receptor positive status [ER+]: Secondary | ICD-10-CM

## 2014-02-27 HISTORY — DX: Malignant neoplasm of unspecified site of unspecified female breast: C50.919

## 2014-02-27 MED ORDER — TAMOXIFEN CITRATE 20 MG PO TABS: 20.0000 mg | ORAL_TABLET | Freq: Every day | ORAL | Status: DC

## 2014-02-27 NOTE — Assessment & Plan Note (Signed)
DCIS left breast status post lumpectomy ER/PR positive  I discussed with her the results of the final pathology report and provided her with a copy of it. It showed high-grade DCIS 2.5 cm in size. I recommended that after finishing radiation therapy that she should consider stopping antiestrogen therapy with tamoxifen. I discussed with her the difference between tamoxifen and aromatase inhibitors.   Tamoxifen counseling: I discussed the risks and benefits of tamoxifen. These include but not limited to insomnia, hot flashes, mood changes, vaginal dryness, and weight gain. Although rare, serious side effects including endometrial cancer, risk of blood clots were also discussed. We strongly believe that the benefits far outweigh the risks. Patient understands these risks and consented to starting treatment. Planned treatment duration is 5 years. I provided her with a prescription for tamoxifen that she will need to fill after the radiation is complete. I would like to see her back in January to review any tolerance and toxicities issues to tamoxifen therapy.

## 2014-02-27 NOTE — Progress Notes (Addendum)
CC: Dr. Autumn Messing III, Dr. Carol Ada, Dr. Nicholas Lose  Followup note:  Diagnosis: Stage 0 (Tis N0 M0) high-grade DCIS of the left breast  History: [name] is seen today for review and scheduling of her radiation therapy in the management of her high-grade DCIS of the right breast. I first saw the patient at the [facility] on 01/29/2014. At the time of a screening mammogram at [facility] on 01/13/2014, she was noted to have a new cluster of heterogeneous calcifications in the left breast at 11:00. Additional views showed a 0.5 cm area of punctate calcifications in the left breast central to the nipple in the retroareolar region. Biopsy on 01/23/2014 was diagnostic for high-grade DCIS with comedonecrosis and calcifications. Breast MR on 01/27/2014 showed biopsy changes within the anterior subareolar left breast along with background enhancement limiting the evaluation of the extent of her disease. On 02/12/2014 she underwent a left partial mastectomy. She is found to have DCIS with calcifications spanning 2.5 cm. Surgical margins were at least 2 mm all margins. Her primary tumor was ER positive at 100% and PR positive at 90%. She is without complaints today.  Physical examination: Alert and oriented. Filed Vitals:   02/27/14 1036  BP: 122/87  Pulse: 72  Temp: 98.3 F (36.8 C)  Resp: 20   Nodes: There is no palpable cervical, supraclavicular, or axillary lymphadenopathy. Breasts: There is a periareolar wound extending from 9 to 1:00 along left breast. The wound is healing well. No masses are appreciated. Right breast without masses or lesions. Extremities: Without edema.  Impression: High-grade DCIS of the left breast. We discussed the potential acute and late toxicities of radiation therapy and the possible use of deep inspiration breath-hold technology for cardiac sparing. With her high-grade disease and a 2 mm margin, I will give her a boost following standard fractionated radiation therapy. We will  go ahead and get her scheduled for a baseline left breast mammogram to confirm removal of all suspicious microcalcifications. This be performed the week of October 12, and she'll return for CT simulation the week of October 19. Consent is signed today.  Plan: As above. Following radiation therapy she will be evaluated by Dr. Lindi Adie for antiestrogen therapy.  30 minutes was spent face-to-face with the patient, primarily counseling the patient and coordinating her care.

## 2014-02-27 NOTE — Progress Notes (Signed)
Please see the Nurse Progress Note in the MD Initial Consult Encounter for this patient. 

## 2014-02-27 NOTE — Telephone Encounter (Signed)
per pof to sch pt appt-gave pt copy of sch °

## 2014-02-27 NOTE — Progress Notes (Signed)
Patient Care Team: Candace Wyline Copas, MD as PCP - General (Family Medicine)  DIAGNOSIS: Breast cancer of upper-outer quadrant of left female breast   Primary site: Breast (Left)   Staging method: AJCC 7th Edition   Clinical: Stage 0 (Tis, N0, cM0)   Summary: Stage 0 (Tis, N0, cM0)   Clinical comments: Staged at breast conference on 01/29/14.   SUMMARY OF ONCOLOGIC HISTORY:   Breast cancer of upper-outer quadrant of left female breast   01/27/2014 Initial Diagnosis High-grade DCIS with necrosis ER/PR positive   01/27/2014 Breast MRI Left breast subareolar region 1.6 x 1.6 x 2.1 cm   02/12/2014 Surgery Left breast lumpectomy DCIS with calcifications high-grade 2.5 cm margins negative reexcision of the margins benign ER 100%, PR 90%    CHIEF COMPLIANT: Patient is see her after surgery for DCIS  INTERVAL HISTORY: Erin Barnes is a 58 year old Caucasian lady with above-mentioned history of high-grade DCIS diagnosed through a biopsy on 01/27/2014. She underwent lumpectomy on 02/12/2014 that revealed high-grade DCIS 2.5 cm in size ER PR were positive. She is here today to discuss the results of pathology and to talk about adjuvant treatment options. She is scheduled to start radiation therapy towards end of October. She is slightly sore from the surgery but otherwise recovering very well from it.  REVIEW OF SYSTEMS:   Constitutional: Denies fevers, chills or abnormal weight loss Eyes: Denies blurriness of vision Ears, nose, mouth, throat, and face: Denies mucositis or sore throat Respiratory: Denies cough, dyspnea or wheezes Cardiovascular: Denies palpitation, chest discomfort or lower extremity swelling Gastrointestinal:  Denies nausea, heartburn or change in bowel habits Skin: Denies abnormal skin rashes Lymphatics: Denies new lymphadenopathy or easy bruising Neurological:Denies numbness, tingling or new weaknesses Behavioral/Psych: Mood is stable, no new changes  Breast: Soreness in the  breast All other systems were reviewed with the patient and are negative.  I have reviewed the past medical history, past surgical history, social history and family history with the patient and they are unchanged from previous note.  ALLERGIES:  has No Known Allergies.  MEDICATIONS:  Current Outpatient Prescriptions  Medication Sig Dispense Refill  . ALPRAZolam (XANAX) 0.5 MG tablet Take 0.5 mg by mouth at bedtime as needed for anxiety.      . Biotin 5000 MCG CAPS Take 1 capsule by mouth daily.      . celecoxib (CELEBREX) 200 MG capsule Take 200 mg by mouth 2 (two) times daily.      Marland Kitchen escitalopram (LEXAPRO) 20 MG tablet Take 20 mg by mouth daily.      . fluorouracil (EFUDEX) 5 % cream       . hydrocortisone 2.5 % cream       . Milk Thistle 200 MG CAPS Take 100 mg by mouth 3 (three) times daily.      . nebivolol (BYSTOLIC) 10 MG tablet Take 10 mg by mouth daily.      Marland Kitchen thyroid (ARMOUR) 30 MG tablet Take 30 mg by mouth daily before breakfast.      . traMADol (ULTRAM) 50 MG tablet Take 50 mg by mouth every 6 (six) hours as needed.      . zaleplon (SONATA) 10 MG capsule Take 10 mg by mouth at bedtime as needed for sleep.      . tamoxifen (NOLVADEX) 20 MG tablet Take 1 tablet (20 mg total) by mouth daily.  30 tablet  0   No current facility-administered medications for this visit.    PHYSICAL EXAMINATION: ECOG  PERFORMANCE STATUS: 0 - Asymptomatic  Filed Vitals:   02/27/14 1356  BP: 135/88  Pulse: 70  Temp: 98.2 F (36.8 C)  Resp: 18   Filed Weights   02/27/14 1356  Weight: 167 lb 12.8 oz (76.114 kg)    GENERAL:alert, no distress and comfortable SKIN: skin color, texture, turgor are normal, no rashes or significant lesions EYES: normal, Conjunctiva are pink and non-injected, sclera clear OROPHARYNX:no exudate, no erythema and lips, buccal mucosa, and tongue normal  NECK: supple, thyroid normal size, non-tender, without nodularity LYMPH:  no palpable lymphadenopathy in the  cervical, axillary or inguinal LUNGS: clear to auscultation and percussion with normal breathing effort HEART: regular rate & rhythm and no murmurs and no lower extremity edema ABDOMEN:abdomen soft, non-tender and normal bowel sounds Musculoskeletal:no cyanosis of digits and no clubbing  NEURO: alert & oriented x 3 with fluent speech, no focal motor/sensory deficits  LABORATORY DATA:  I have reviewed the data as listed   Chemistry      Component Value Date/Time   NA 138 01/29/2014 1209   K 4.3 01/29/2014 1209   CO2 26 01/29/2014 1209   BUN 6.9* 01/29/2014 1209   CREATININE 0.8 01/29/2014 1209      Component Value Date/Time   CALCIUM 9.5 01/29/2014 1209   ALKPHOS 70 01/29/2014 1209   AST 44* 01/29/2014 1209   ALT 46 01/29/2014 1209   BILITOT 0.43 01/29/2014 1209       Lab Results  Component Value Date   WBC 8.3 01/29/2014   HGB 14.4 01/29/2014   HCT 42.7 01/29/2014   MCV 98.5 01/29/2014   PLT 281 01/29/2014   NEUTROABS 6.5 01/29/2014     RADIOGRAPHIC STUDIES: I have personally reviewed the radiology reports and agreed with their findings. No results found.   ASSESSMENT & PLAN:  Breast cancer of upper-outer quadrant of left female breast DCIS left breast status post lumpectomy ER/PR positive  I discussed with her the results of the final pathology report and provided her with a copy of it. It showed high-grade DCIS 2.5 cm in size. I recommended that after finishing radiation therapy that she should consider stopping antiestrogen therapy with tamoxifen. I discussed with her the difference between tamoxifen and aromatase inhibitors.   Tamoxifen counseling: I discussed the risks and benefits of tamoxifen. These include but not limited to insomnia, hot flashes, mood changes, vaginal dryness, and weight gain. Although rare, serious side effects including endometrial cancer, risk of blood clots were also discussed. We strongly believe that the benefits far outweigh the risks. Patient understands these risks  and consented to starting treatment. Planned treatment duration is 5 years. I provided her with a prescription for tamoxifen that she will need to fill after the radiation is complete. I would like to see her back in January to review any tolerance and toxicities issues to tamoxifen therapy.    No orders of the defined types were placed in this encounter.   The patient has a good understanding of the overall plan. she agrees with it. She will call with any problems that may develop before her next visit here.  I spent 20 minutes counseling the patient face to face. The total time spent in the appointment was 25 minutes and more than 50% was on counseling and review of test results    Rulon Eisenmenger, MD 02/27/2014 2:20 PM

## 2014-02-27 NOTE — Addendum Note (Signed)
Encounter addended by: Rexene Edison, MD on: 02/27/2014  6:45 PM<BR>     Documentation filed: Flowsheet VN, Visit Diagnoses, Orders

## 2014-02-28 ENCOUNTER — Telehealth: Payer: Self-pay | Admitting: *Deleted

## 2014-02-28 NOTE — Telephone Encounter (Signed)
Called patient to inform of mammogram for 03-10-14- arrival time - 10:30 am @ [facility], spoke with patient and she is aware of this test.

## 2014-03-04 ENCOUNTER — Institutional Professional Consult (permissible substitution): Payer: Managed Care, Other (non HMO) | Admitting: Radiation Oncology

## 2014-03-08 NOTE — Progress Notes (Signed)
Reviewed by Dr Lindi Adie.

## 2014-03-17 ENCOUNTER — Ambulatory Visit
Admission: RE | Admit: 2014-03-17 | Discharge: 2014-03-17 | Disposition: A | Discharge status: Home / Self Care | Payer: BC Managed Care – PPO | Source: Ambulatory Visit | Attending: Radiation Oncology | Admitting: Radiation Oncology

## 2014-03-17 ENCOUNTER — Other Ambulatory Visit: Payer: Self-pay | Admitting: Radiation Oncology

## 2014-03-17 DIAGNOSIS — C50412 Malignant neoplasm of upper-outer quadrant of left female breast: Secondary | ICD-10-CM

## 2014-03-17 DIAGNOSIS — Z510 Encounter for antineoplastic radiation therapy: Secondary | ICD-10-CM | POA: Diagnosis not present

## 2014-03-17 NOTE — Progress Notes (Signed)
Complex simulation/treatment planning note: The patient was taken to the CT simulation room and placed supine. A Vac lock immobilization device was constructed on a custom breast board. Her left breast borders were marked with radiopaque wires along with her partial mastectomy wound. She was then scanned free breathing. The cardiac silhouette was in the treatment field. She was rescanned with deep inspiration and breath-hold and the cardiac silhouette moved away from the tangent field. I chose an isocenter. The CT data set was sent to the planning system right contoured her tumor bed, free breathing, and also with deep inspiration and breath-hold. The normal structures including the heart and lungs were contoured by dosimetry. These were reviewed. I set her up to tangential fields, medially and laterally to her left breast. Unique sets of multileaf collimators were designed. She is now ready for 3-D simulation to deliver 4800 cGy to left breast and 24 sessions with a left breast boost of 1000 cGy 5 sessions with 12 MEV electrons.

## 2014-03-18 ENCOUNTER — Encounter: Payer: Self-pay | Admitting: Radiation Oncology

## 2014-03-18 DIAGNOSIS — Z510 Encounter for antineoplastic radiation therapy: Secondary | ICD-10-CM | POA: Diagnosis not present

## 2014-03-18 NOTE — Progress Notes (Signed)
3-D simulation note: The patient underwent 3-D simulation for treatment to her left breast with deep inspiration/breath-hold. She is up to 1 static field and one electron compensation field for each tangent. Thus she has a total of  complex treatment devices for her to tangent fields. Dose volume histograms were obtained for the target structures including her tumor bed and avoidance structures including the lungs and heart. We met our departmental guidelines. I prescribing 4800 cGy in 24 sessions utilizing mixed 6 MV/10 MV photons.

## 2014-03-19 DIAGNOSIS — Z510 Encounter for antineoplastic radiation therapy: Secondary | ICD-10-CM | POA: Diagnosis not present

## 2014-03-24 ENCOUNTER — Ambulatory Visit: Payer: BC Managed Care – PPO | Admitting: Radiation Oncology

## 2014-03-25 ENCOUNTER — Ambulatory Visit: Payer: BC Managed Care – PPO

## 2014-03-26 ENCOUNTER — Ambulatory Visit: Payer: BC Managed Care – PPO

## 2014-03-27 ENCOUNTER — Ambulatory Visit: Payer: BC Managed Care – PPO

## 2014-03-28 ENCOUNTER — Ambulatory Visit: Payer: BC Managed Care – PPO

## 2014-03-31 ENCOUNTER — Ambulatory Visit: Payer: BC Managed Care – PPO

## 2014-03-31 ENCOUNTER — Ambulatory Visit
Admission: RE | Admit: 2014-03-31 | Discharge: 2014-03-31 | Disposition: A | Discharge status: Home / Self Care | Payer: BC Managed Care – PPO | Source: Ambulatory Visit | Attending: Radiation Oncology | Admitting: Radiation Oncology

## 2014-03-31 DIAGNOSIS — Z510 Encounter for antineoplastic radiation therapy: Secondary | ICD-10-CM | POA: Diagnosis not present

## 2014-03-31 DIAGNOSIS — C50412 Malignant neoplasm of upper-outer quadrant of left female breast: Secondary | ICD-10-CM

## 2014-03-31 NOTE — Progress Notes (Signed)
Simulation verification note: The patient underwent simulation verification for treatment to her left breast. Her isocenter is in good position and the multileaf collimators contoured the treatment volume appropriately. 

## 2014-04-01 ENCOUNTER — Ambulatory Visit: Payer: BC Managed Care – PPO

## 2014-04-01 ENCOUNTER — Ambulatory Visit
Admission: RE | Admit: 2014-04-01 | Discharge: 2014-04-01 | Disposition: A | Discharge status: Home / Self Care | Payer: BC Managed Care – PPO | Source: Ambulatory Visit | Attending: Radiation Oncology | Admitting: Radiation Oncology

## 2014-04-01 DIAGNOSIS — Z510 Encounter for antineoplastic radiation therapy: Secondary | ICD-10-CM | POA: Diagnosis not present

## 2014-04-02 ENCOUNTER — Ambulatory Visit: Payer: BC Managed Care – PPO

## 2014-04-02 ENCOUNTER — Ambulatory Visit
Admission: RE | Admit: 2014-04-02 | Discharge: 2014-04-02 | Disposition: A | Discharge status: Home / Self Care | Payer: BC Managed Care – PPO | Source: Ambulatory Visit | Attending: Radiation Oncology | Admitting: Radiation Oncology

## 2014-04-02 DIAGNOSIS — Z510 Encounter for antineoplastic radiation therapy: Secondary | ICD-10-CM | POA: Diagnosis not present

## 2014-04-03 ENCOUNTER — Ambulatory Visit
Admission: RE | Admit: 2014-04-03 | Discharge: 2014-04-03 | Disposition: A | Discharge status: Home / Self Care | Payer: BC Managed Care – PPO | Source: Ambulatory Visit | Attending: Radiation Oncology | Admitting: Radiation Oncology

## 2014-04-03 DIAGNOSIS — Z510 Encounter for antineoplastic radiation therapy: Secondary | ICD-10-CM | POA: Diagnosis not present

## 2014-04-03 DIAGNOSIS — C50212 Malignant neoplasm of upper-inner quadrant of left female breast: Secondary | ICD-10-CM | POA: Insufficient documentation

## 2014-04-03 MED ORDER — ALRA NON-METALLIC DEODORANT (RAD-ONC)
1.0000 "application " | Freq: Once | TOPICAL | Status: AC
  Administered 2014-04-03: 1 via TOPICAL

## 2014-04-03 MED ORDER — RADIAPLEXRX EX GEL
Freq: Once | CUTANEOUS | Status: AC
  Administered 2014-04-03: 18:00:00 via TOPICAL

## 2014-04-03 NOTE — Progress Notes (Signed)
Education today regarding potential side-effects related to radiation therapy to the breast: fatigue, skin changes to breast(redenss, tanning, dry/moist peel), and pain.  Given Radiaplex Gel with instructions to apply twice daily after treatment and at bedtime.  If applied before treatment ensure that has been on the skin at least 4 hours prior to daily treatment time, and she stated understanding.  Alra Deodorant given and instructed to apply in a small area 3-4 days in a row to ensure that it is not irritating to her skin.  Given the Radiation Therapy and You booklet with the appropriate pages marked.  Business card from [facility] attached to booklet.

## 2014-04-04 ENCOUNTER — Ambulatory Visit
Admission: RE | Admit: 2014-04-04 | Discharge: 2014-04-04 | Disposition: A | Discharge status: Home / Self Care | Payer: BC Managed Care – PPO | Source: Ambulatory Visit | Attending: Radiation Oncology | Admitting: Radiation Oncology

## 2014-04-04 DIAGNOSIS — Z510 Encounter for antineoplastic radiation therapy: Secondary | ICD-10-CM | POA: Diagnosis not present

## 2014-04-07 ENCOUNTER — Encounter: Payer: Self-pay | Admitting: Radiation Oncology

## 2014-04-07 ENCOUNTER — Ambulatory Visit
Admission: RE | Admit: 2014-04-07 | Discharge: 2014-04-07 | Disposition: A | Discharge status: Home / Self Care | Payer: BC Managed Care – PPO | Source: Ambulatory Visit | Attending: Radiation Oncology | Admitting: Radiation Oncology

## 2014-04-07 VITALS — BP 143/98 | HR 70 | Temp 99.4°F | Resp 12 | Wt 163.2 lb

## 2014-04-07 DIAGNOSIS — Z510 Encounter for antineoplastic radiation therapy: Secondary | ICD-10-CM | POA: Diagnosis not present

## 2014-04-07 DIAGNOSIS — C50412 Malignant neoplasm of upper-outer quadrant of left female breast: Secondary | ICD-10-CM

## 2014-04-07 NOTE — Progress Notes (Signed)
She is currently in no pain.  Pt left breast skin warm dry and intact. No edema noted. The patient eats a regular, healthy diet.

## 2014-04-07 NOTE — Progress Notes (Signed)
   Weekly Management Note:  outpatient    ICD-9-CM ICD-10-CM   1. Breast cancer of upper-outer quadrant of left female breast 174.4 C50.412     Current Dose:  10 Gy  Projected Dose: 48 Gy initial   Narrative:  The patient presents for routine under treatment assessment.  CBCT/MVCT images/Port film x-rays were reviewed.  The chart was checked. No complaints  Physical Findings:  weight is 163 lb 3.2 oz (74.027 kg). Her oral temperature is 99.4 F (37.4 C). Her blood pressure is 143/98 and her pulse is 70. Her respiration is 12 and oxygen saturation is 98%.  NAD, skin without RT change  Impression:  The patient is tolerating radiotherapy.  Plan:  Continue radiotherapy as planned.    ________________________________   Eppie Gibson, M.D.

## 2014-04-08 ENCOUNTER — Ambulatory Visit
Admission: RE | Admit: 2014-04-08 | Discharge: 2014-04-08 | Disposition: A | Discharge status: Home / Self Care | Payer: BC Managed Care – PPO | Source: Ambulatory Visit | Attending: Radiation Oncology | Admitting: Radiation Oncology

## 2014-04-08 DIAGNOSIS — Z510 Encounter for antineoplastic radiation therapy: Secondary | ICD-10-CM | POA: Diagnosis not present

## 2014-04-09 ENCOUNTER — Ambulatory Visit
Admission: RE | Admit: 2014-04-09 | Discharge: 2014-04-09 | Disposition: A | Discharge status: Home / Self Care | Payer: BC Managed Care – PPO | Source: Ambulatory Visit | Attending: Radiation Oncology | Admitting: Radiation Oncology

## 2014-04-09 DIAGNOSIS — Z510 Encounter for antineoplastic radiation therapy: Secondary | ICD-10-CM | POA: Diagnosis not present

## 2014-04-10 ENCOUNTER — Ambulatory Visit
Admission: RE | Admit: 2014-04-10 | Discharge: 2014-04-10 | Disposition: A | Discharge status: Home / Self Care | Payer: BC Managed Care – PPO | Source: Ambulatory Visit | Attending: Radiation Oncology | Admitting: Radiation Oncology

## 2014-04-10 DIAGNOSIS — Z510 Encounter for antineoplastic radiation therapy: Secondary | ICD-10-CM | POA: Diagnosis not present

## 2014-04-11 ENCOUNTER — Ambulatory Visit
Admission: RE | Admit: 2014-04-11 | Discharge: 2014-04-11 | Disposition: A | Discharge status: Home / Self Care | Payer: BC Managed Care – PPO | Source: Ambulatory Visit | Attending: Radiation Oncology | Admitting: Radiation Oncology

## 2014-04-11 DIAGNOSIS — Z510 Encounter for antineoplastic radiation therapy: Secondary | ICD-10-CM | POA: Diagnosis not present

## 2014-04-14 ENCOUNTER — Ambulatory Visit
Admission: RE | Admit: 2014-04-14 | Discharge: 2014-04-14 | Disposition: A | Discharge status: Home / Self Care | Payer: BC Managed Care – PPO | Source: Ambulatory Visit | Attending: Radiation Oncology | Admitting: Radiation Oncology

## 2014-04-14 ENCOUNTER — Encounter: Payer: Self-pay | Admitting: Radiation Oncology

## 2014-04-14 VITALS — BP 109/74 | HR 76 | Temp 97.7°F | Resp 20 | Wt 152.2 lb

## 2014-04-14 DIAGNOSIS — Z510 Encounter for antineoplastic radiation therapy: Secondary | ICD-10-CM | POA: Diagnosis not present

## 2014-04-14 DIAGNOSIS — C50412 Malignant neoplasm of upper-outer quadrant of left female breast: Secondary | ICD-10-CM

## 2014-04-14 NOTE — Progress Notes (Signed)
Weekly Management Note:  Site:left breast Current Dose:  2000  cGy Projected Dose: 4800 cGy followed by left breast boost (1000 cGy in 5 sessions)  Narrative: The patient is seen today for routine under treatment assessment. CBCT/MVCT images/port films were reviewed. The chart was reviewed.   She is without complaints today. She uses Radioplex gel.  Physical Examination:  Filed Vitals:   04/14/14 0833  BP: 109/74  Pulse: 76  Temp: 97.7 F (36.5 C)  Resp: 20  .  Weight: 152 lb 3.2 oz (69.037 kg). No significant skin changes.  Impression: Tolerating radiation therapy well.  Plan: Continue radiation therapy as planned.

## 2014-04-14 NOTE — Progress Notes (Signed)
Patient denies pain, fatigue, loss of appetite. She is applying Radiaplex to left breast; no skin changes at this time. 

## 2014-04-15 ENCOUNTER — Ambulatory Visit
Admission: RE | Admit: 2014-04-15 | Discharge: 2014-04-15 | Disposition: A | Discharge status: Home / Self Care | Payer: BC Managed Care – PPO | Source: Ambulatory Visit | Attending: Radiation Oncology | Admitting: Radiation Oncology

## 2014-04-15 DIAGNOSIS — Z510 Encounter for antineoplastic radiation therapy: Secondary | ICD-10-CM | POA: Diagnosis not present

## 2014-04-16 ENCOUNTER — Ambulatory Visit
Admission: RE | Admit: 2014-04-16 | Discharge: 2014-04-16 | Disposition: A | Discharge status: Home / Self Care | Payer: BC Managed Care – PPO | Source: Ambulatory Visit | Attending: Radiation Oncology | Admitting: Radiation Oncology

## 2014-04-16 DIAGNOSIS — Z510 Encounter for antineoplastic radiation therapy: Secondary | ICD-10-CM | POA: Diagnosis not present

## 2014-04-17 ENCOUNTER — Ambulatory Visit
Admission: RE | Admit: 2014-04-17 | Discharge: 2014-04-17 | Disposition: A | Discharge status: Home / Self Care | Payer: BC Managed Care – PPO | Source: Ambulatory Visit | Attending: Radiation Oncology | Admitting: Radiation Oncology

## 2014-04-17 DIAGNOSIS — Z510 Encounter for antineoplastic radiation therapy: Secondary | ICD-10-CM | POA: Diagnosis not present

## 2014-04-18 ENCOUNTER — Ambulatory Visit
Admission: RE | Admit: 2014-04-18 | Discharge: 2014-04-18 | Disposition: A | Discharge status: Home / Self Care | Payer: BC Managed Care – PPO | Source: Ambulatory Visit | Attending: Radiation Oncology | Admitting: Radiation Oncology

## 2014-04-18 DIAGNOSIS — Z510 Encounter for antineoplastic radiation therapy: Secondary | ICD-10-CM | POA: Diagnosis not present

## 2014-04-20 ENCOUNTER — Encounter: Payer: Self-pay | Admitting: Radiation Oncology

## 2014-04-20 ENCOUNTER — Ambulatory Visit
Admission: RE | Admit: 2014-04-20 | Discharge: 2014-04-20 | Disposition: A | Discharge status: Home / Self Care | Payer: BC Managed Care – PPO | Source: Ambulatory Visit | Attending: Radiation Oncology | Admitting: Radiation Oncology

## 2014-04-20 VITALS — BP 118/78 | HR 79 | Temp 97.5°F | Resp 20 | Wt 165.7 lb

## 2014-04-20 DIAGNOSIS — Z510 Encounter for antineoplastic radiation therapy: Secondary | ICD-10-CM | POA: Diagnosis not present

## 2014-04-20 DIAGNOSIS — C50412 Malignant neoplasm of upper-outer quadrant of left female breast: Secondary | ICD-10-CM

## 2014-04-20 NOTE — Progress Notes (Signed)
Weekly Management Note:   Site: Left breast Current Dose:  3000  cGy Projected Dose: 4800  cGy followed by 5 fraction boost  Narrative: The patient is seen today for routine under treatment assessment. CBCT/MVCT images/port films were reviewed. The chart was reviewed.   She is without new complaints today.  She uses Radioplex gel.  Physical Examination:  Filed Vitals:   04/20/14 0724  BP: 118/78  Pulse: 79  Temp: 97.5 F (36.4 C)  Resp: 20  .  Weight: 165 lb 11.2 oz (75.161 kg).  There is a mild papular radiation dermatitis along the upper inner quadrant of the left breast along  "sun exposed" skin.  No areas of desquamation.  Impression: Tolerating radiation therapy well.  Plan: Continue radiation therapy as planned.

## 2014-04-20 NOTE — Progress Notes (Signed)
Weekly left breast ca  Rash dermatitis  Mid chest using radiaplex gel bid, no itching, skin intact,  Appetite good,  No pain 7:27 AM

## 2014-04-20 NOTE — Progress Notes (Signed)
Complex simulation note: The patient underwent complex virtual simulation for her left breast electron beam boost.  She was set up en face.  One custom block was constructed to conform the field.  She is being treated with 15 MEV electrons with electron Monte Carlo calculation.  I prescribing 1000 cGy in 5 sessions.  A special port plan is requested.

## 2014-04-21 ENCOUNTER — Ambulatory Visit: Payer: BC Managed Care – PPO | Admitting: Radiation Oncology

## 2014-04-21 ENCOUNTER — Ambulatory Visit
Admission: RE | Admit: 2014-04-21 | Discharge: 2014-04-21 | Disposition: A | Discharge status: Home / Self Care | Payer: BC Managed Care – PPO | Source: Ambulatory Visit | Attending: Radiation Oncology | Admitting: Radiation Oncology

## 2014-04-21 DIAGNOSIS — Z510 Encounter for antineoplastic radiation therapy: Secondary | ICD-10-CM | POA: Diagnosis not present

## 2014-04-22 ENCOUNTER — Ambulatory Visit
Admission: RE | Admit: 2014-04-22 | Discharge: 2014-04-22 | Disposition: A | Discharge status: Home / Self Care | Payer: BC Managed Care – PPO | Source: Ambulatory Visit | Attending: Radiation Oncology | Admitting: Radiation Oncology

## 2014-04-22 DIAGNOSIS — Z510 Encounter for antineoplastic radiation therapy: Secondary | ICD-10-CM | POA: Diagnosis not present

## 2014-04-23 ENCOUNTER — Ambulatory Visit
Admission: RE | Admit: 2014-04-23 | Discharge: 2014-04-23 | Disposition: A | Discharge status: Home / Self Care | Payer: BC Managed Care – PPO | Source: Ambulatory Visit | Attending: Radiation Oncology | Admitting: Radiation Oncology

## 2014-04-23 DIAGNOSIS — Z510 Encounter for antineoplastic radiation therapy: Secondary | ICD-10-CM | POA: Diagnosis not present

## 2014-04-24 ENCOUNTER — Ambulatory Visit: Payer: BC Managed Care – PPO

## 2014-04-25 ENCOUNTER — Ambulatory Visit: Payer: BC Managed Care – PPO

## 2014-04-28 ENCOUNTER — Ambulatory Visit: Payer: BC Managed Care – PPO | Admitting: Radiation Oncology

## 2014-04-28 ENCOUNTER — Ambulatory Visit
Admission: RE | Admit: 2014-04-28 | Discharge: 2014-04-28 | Disposition: A | Discharge status: Home / Self Care | Payer: BC Managed Care – PPO | Source: Ambulatory Visit | Attending: Radiation Oncology | Admitting: Radiation Oncology

## 2014-04-28 ENCOUNTER — Encounter: Payer: Self-pay | Admitting: Radiation Oncology

## 2014-04-28 VITALS — BP 135/91 | HR 64 | Temp 98.4°F | Resp 20 | Wt 166.8 lb

## 2014-04-28 DIAGNOSIS — Z510 Encounter for antineoplastic radiation therapy: Secondary | ICD-10-CM | POA: Diagnosis not present

## 2014-04-28 DIAGNOSIS — C50412 Malignant neoplasm of upper-outer quadrant of left female breast: Secondary | ICD-10-CM

## 2014-04-28 NOTE — Progress Notes (Signed)
Patient denies pain, fatigue, loss of appetite. She is applying radiaplex to left breast for hyperpigmentation, radiation dermatitis and dry desquamation present in upper left breast. She is applying Cortisone cream to this area for itching.

## 2014-04-28 NOTE — Progress Notes (Signed)
Weekly Management Note:  Site: Left breast  Current Dose:  3800  cGy Projected Dose: 4800  cGy followed by 5 fraction boost  Narrative: The patient is seen today for routine under treatment assessment. CBCT/MVCT images/port films were reviewed. The chart was reviewed.   She is without complaints today.  She uses Radioplex gel.  She uses hydrocortisone cream for pruritus.  Physical Examination:  Filed Vitals:   04/28/14 1519  BP: 135/91  Pulse: 64  Temp: 98.4 F (36.9 C)  Resp: 20  .  Weight: 166 lb 12.8 oz (75.66 kg).  There is moderate erythema of the breast with patchy dry desquamation with a papular radiation dermatitis along the upper inner quadrant.  No areas of moist desquamation.  Impression: Tolerating radiation therapy well.  Plan: Continue radiation therapy as planned.

## 2014-04-29 ENCOUNTER — Ambulatory Visit
Admission: RE | Admit: 2014-04-29 | Discharge: 2014-04-29 | Disposition: A | Discharge status: Home / Self Care | Payer: BC Managed Care – PPO | Source: Ambulatory Visit | Attending: Radiation Oncology | Admitting: Radiation Oncology

## 2014-04-29 DIAGNOSIS — Z510 Encounter for antineoplastic radiation therapy: Secondary | ICD-10-CM | POA: Diagnosis not present

## 2014-04-30 ENCOUNTER — Ambulatory Visit: Payer: BC Managed Care – PPO

## 2014-04-30 ENCOUNTER — Ambulatory Visit
Admission: RE | Admit: 2014-04-30 | Discharge: 2014-04-30 | Disposition: A | Discharge status: Home / Self Care | Payer: BC Managed Care – PPO | Source: Ambulatory Visit | Attending: Radiation Oncology | Admitting: Radiation Oncology

## 2014-04-30 DIAGNOSIS — Z510 Encounter for antineoplastic radiation therapy: Secondary | ICD-10-CM | POA: Diagnosis not present

## 2014-05-01 ENCOUNTER — Ambulatory Visit
Admission: RE | Admit: 2014-05-01 | Discharge: 2014-05-01 | Disposition: A | Discharge status: Home / Self Care | Payer: BC Managed Care – PPO | Source: Ambulatory Visit | Attending: Radiation Oncology | Admitting: Radiation Oncology

## 2014-05-01 DIAGNOSIS — Z510 Encounter for antineoplastic radiation therapy: Secondary | ICD-10-CM | POA: Diagnosis not present

## 2014-05-02 ENCOUNTER — Ambulatory Visit
Admission: RE | Admit: 2014-05-02 | Discharge: 2014-05-02 | Disposition: A | Discharge status: Home / Self Care | Payer: BC Managed Care – PPO | Source: Ambulatory Visit | Attending: Radiation Oncology | Admitting: Radiation Oncology

## 2014-05-02 DIAGNOSIS — Z510 Encounter for antineoplastic radiation therapy: Secondary | ICD-10-CM | POA: Diagnosis not present

## 2014-05-05 ENCOUNTER — Ambulatory Visit
Admission: RE | Admit: 2014-05-05 | Discharge: 2014-05-05 | Disposition: A | Discharge status: Home / Self Care | Payer: BC Managed Care – PPO | Source: Ambulatory Visit | Attending: Radiation Oncology | Admitting: Radiation Oncology

## 2014-05-05 ENCOUNTER — Encounter: Payer: Self-pay | Admitting: Radiation Oncology

## 2014-05-05 VITALS — BP 136/90 | HR 70 | Resp 18 | Wt 162.8 lb

## 2014-05-05 DIAGNOSIS — C50412 Malignant neoplasm of upper-outer quadrant of left female breast: Secondary | ICD-10-CM

## 2014-05-05 DIAGNOSIS — Z510 Encounter for antineoplastic radiation therapy: Secondary | ICD-10-CM | POA: Diagnosis not present

## 2014-05-05 NOTE — Progress Notes (Signed)
Reports mild fatigue. BP slightly elevated. Hyperpigmentation of left/treated breast noted. Reports left breast is dry and itchy. Reports using radiaplex bid. Understands she can use hydrocortisone on affect skin. Denies pain. No edema of left arm noted.

## 2014-05-05 NOTE — Progress Notes (Signed)
Weekly Management Note:  Site: Left breast Current Dose:  4800  cGy Projected Dose: 5800  cGy including boost  Narrative: The patient is seen today for routine under treatment assessment. CBCT/MVCT images/port films were reviewed. The chart was reviewed.   She is without new complaints today.  She uses Radioplex gel.  She does have some pruritus and can use hydrocortisone cream as well.  Physical Examination:  Filed Vitals:   05/05/14 1600  BP: 136/90  Pulse: 70  Resp: 18  .  Weight: 162 lb 12.8 oz (73.846 kg).  There is erythema along the left breast which is a papular along the upper inner quadrant and inframammary region.  There is patchy dry desquamation with no areas of moist desquamation.  Impression: Tolerating radiation therapy well.  Plan: Continue radiation therapy as planned.

## 2014-05-06 ENCOUNTER — Ambulatory Visit
Admission: RE | Admit: 2014-05-06 | Discharge: 2014-05-06 | Disposition: A | Discharge status: Home / Self Care | Payer: BC Managed Care – PPO | Source: Ambulatory Visit | Attending: Radiation Oncology | Admitting: Radiation Oncology

## 2014-05-06 ENCOUNTER — Ambulatory Visit: Payer: BC Managed Care – PPO

## 2014-05-06 DIAGNOSIS — Z510 Encounter for antineoplastic radiation therapy: Secondary | ICD-10-CM | POA: Diagnosis not present

## 2014-05-07 ENCOUNTER — Ambulatory Visit: Payer: BC Managed Care – PPO

## 2014-05-07 ENCOUNTER — Ambulatory Visit
Admission: RE | Admit: 2014-05-07 | Discharge: 2014-05-07 | Disposition: A | Discharge status: Home / Self Care | Payer: BC Managed Care – PPO | Source: Ambulatory Visit | Attending: Radiation Oncology | Admitting: Radiation Oncology

## 2014-05-07 DIAGNOSIS — Z510 Encounter for antineoplastic radiation therapy: Secondary | ICD-10-CM | POA: Diagnosis not present

## 2014-05-08 ENCOUNTER — Ambulatory Visit
Admission: RE | Admit: 2014-05-08 | Discharge: 2014-05-08 | Disposition: A | Discharge status: Home / Self Care | Payer: BC Managed Care – PPO | Source: Ambulatory Visit | Attending: Radiation Oncology | Admitting: Radiation Oncology

## 2014-05-08 DIAGNOSIS — Z510 Encounter for antineoplastic radiation therapy: Secondary | ICD-10-CM | POA: Diagnosis not present

## 2014-05-09 ENCOUNTER — Ambulatory Visit
Admission: RE | Admit: 2014-05-09 | Discharge: 2014-05-09 | Disposition: A | Discharge status: Home / Self Care | Payer: BC Managed Care – PPO | Source: Ambulatory Visit | Attending: Radiation Oncology | Admitting: Radiation Oncology

## 2014-05-09 DIAGNOSIS — Z510 Encounter for antineoplastic radiation therapy: Secondary | ICD-10-CM | POA: Diagnosis not present

## 2014-05-12 ENCOUNTER — Ambulatory Visit
Admission: RE | Admit: 2014-05-12 | Discharge: 2014-05-12 | Disposition: A | Discharge status: Home / Self Care | Payer: BC Managed Care – PPO | Source: Ambulatory Visit | Attending: Radiation Oncology | Admitting: Radiation Oncology

## 2014-05-12 ENCOUNTER — Encounter: Payer: Self-pay | Admitting: Radiation Oncology

## 2014-05-12 ENCOUNTER — Ambulatory Visit: Payer: BC Managed Care – PPO

## 2014-05-12 VITALS — BP 119/82 | HR 77 | Temp 98.1°F | Resp 20 | Wt 165.3 lb

## 2014-05-12 DIAGNOSIS — C50412 Malignant neoplasm of upper-outer quadrant of left female breast: Secondary | ICD-10-CM

## 2014-05-12 DIAGNOSIS — Z510 Encounter for antineoplastic radiation therapy: Secondary | ICD-10-CM | POA: Diagnosis not present

## 2014-05-12 NOTE — Progress Notes (Signed)
Weekly Management Note:  Site: Left breast Current Dose:  5800  cGy Projected Dose: 5800  cGy  Narrative: The patient is seen today for routine under treatment assessment. CBCT/MVCT images/port films were reviewed. The chart was reviewed.   She is without new complaints today.  She uses Radioplex gel.  She finishes her treatment today.  Physical Examination:  Filed Vitals:   05/12/14 0834  BP: 119/82  Pulse: 77  Temp: 98.1 F (36.7 C)  Resp: 20  .  Weight: 165 lb 4.8 oz (74.98 kg).  There is diffuse erythema along her left breast with patchy dry desquamation.  No areas of moist desquamation.  Impression: Radiation therapy well tolerated.  Plan: One-month follow-up appointment.

## 2014-05-12 NOTE — Progress Notes (Signed)
Patient denies pain, fatigue, loss of appetite. She is applying Radiaplex to left breast for dry desquamation, hyperpigmentation, applying Cortisone cream as needed for itching. She completes today, discussed skin care, gave her FU card.

## 2014-05-12 NOTE — Progress Notes (Signed)
[facility] End of Treatment Note  Name:Arelene Burchfield  Date: 05/12/2014 TFT:[id number] DOB:01-Jan-1956   Status:outpatient    CC: Reginia Naas, MD  Dr. Autumn Messing III  REFERRING PHYSICIAN:  Dr. 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  15 MEV electrons, left breast boost              NARRATIVE:   Ms. Foutz tolerated treatment well with the expected degree of radiation dermatitis by completion of therapy.  She had dry desquamation on completion of therapy.  She used Radioplex gel during her course of therapy.                         PLAN: Routine followup in one month. Patient instructed to call if questions or worsening complaints in interim.  She will discuss  adjuvant antiestrogen therapy with Dr. Lindi Adie.

## 2014-05-13 ENCOUNTER — Ambulatory Visit: Payer: BC Managed Care – PPO

## 2014-05-14 ENCOUNTER — Ambulatory Visit: Payer: BC Managed Care – PPO

## 2014-05-19 ENCOUNTER — Encounter: Payer: Self-pay | Admitting: Radiation Oncology

## 2014-05-19 NOTE — Progress Notes (Signed)
Simulation verification note: The patient underwent simulation verification for her left breast boost on 05/05/2014.  She was set up en face with 15 MEV electrons.  I personally checked her set up.

## 2014-06-03 ENCOUNTER — Telehealth: Payer: Self-pay | Admitting: Hematology and Oncology

## 2014-06-03 NOTE — Telephone Encounter (Signed)
returned call and s.w pt and transferred to radiation

## 2014-06-06 ENCOUNTER — Ambulatory Visit: Payer: BC Managed Care – PPO | Admitting: Hematology and Oncology

## 2014-06-06 NOTE — Assessment & Plan Note (Signed)
DCIS left breast status post lumpectomy ER/PR +2.5 cm in size high-grade status post radiation therapy currently on tamoxifen as December 2015  Tamoxifen toxicities: Breast cancer surveillance: mammograms annually in August and periodic breast exams  Return to clinic in 3 months for follow-up

## 2014-06-10 ENCOUNTER — Telehealth: Payer: Self-pay

## 2014-06-10 ENCOUNTER — Ambulatory Visit: Payer: Self-pay | Admitting: Radiation Oncology

## 2014-06-10 NOTE — Telephone Encounter (Signed)
LMOVM - confirming pt started tamoxifen.  Follow up appointment needs to be scheduled.  Requested pt return call to clinic.

## 2014-06-13 ENCOUNTER — Other Ambulatory Visit: Payer: Self-pay

## 2014-06-16 ENCOUNTER — Other Ambulatory Visit: Payer: Self-pay | Admitting: *Deleted

## 2014-06-16 ENCOUNTER — Telehealth: Payer: Self-pay | Admitting: Hematology and Oncology

## 2014-06-16 NOTE — Telephone Encounter (Signed)
s.w. pt and advised on Feb appt....ok and aware °

## 2014-06-17 ENCOUNTER — Ambulatory Visit: Payer: Self-pay | Admitting: Radiation Oncology

## 2014-06-23 ENCOUNTER — Encounter: Payer: Self-pay | Admitting: Radiation Oncology

## 2014-06-24 ENCOUNTER — Ambulatory Visit
Admission: RE | Admit: 2014-06-24 | Discharge: 2014-06-24 | Disposition: A | Discharge status: Home / Self Care | Payer: BLUE CROSS/BLUE SHIELD | Source: Ambulatory Visit | Attending: Radiation Oncology | Admitting: Radiation Oncology

## 2014-06-24 ENCOUNTER — Encounter: Payer: Self-pay | Admitting: Radiation Oncology

## 2014-06-24 VITALS — BP 133/92 | HR 69 | Temp 98.2°F | Ht 64.0 in | Wt 166.0 lb

## 2014-06-24 DIAGNOSIS — C50412 Malignant neoplasm of upper-outer quadrant of left female breast: Secondary | ICD-10-CM

## 2014-06-24 HISTORY — DX: Personal history of irradiation: Z92.3

## 2014-06-24 NOTE — Progress Notes (Signed)
Erin Barnes has no voiced concerns related to radiation therapy.  She is very concerned about taking her Nolvadex due to concerns about her decrease in her bone density and has not started it.  Skin  Intact, left breast, with mild erythema in the upper,inner portion.

## 2014-06-24 NOTE — Progress Notes (Signed)
CC: Dr. Autumn Messing III  Follow-up note:  [name] returns today approximately 5 weeks following completion of radiation therapy following conservative surgery in the management of her high-grade DCIS of the left breast.  She is without complaints today.  She has her prescription for adjuvant tamoxifen, but has not yet started it.  She tells me she will see Dr. Lindi Barnes in the near future, and Dr. Marlou Barnes sometime this summer.  Physical examination: Alert and oriented. Filed Vitals:   06/24/14 1153  BP: 133/92  Pulse: 69  Temp: 98.2 F (36.8 C)   Nodes: Without palpable cervical, supraclavicular, or axillary lymphadenopathy.  Chest: Lungs clear.  Breasts: There is residual erythema along the left breast.  Minimal thickening.  No masses are appreciated.  Right breast without masses or lesions.  Extremities: Without edema.  Impression: Satisfactory progress.  I instructed her to begin her adjuvant tamoxifen.  Plan: Follow-up with Dr. Lindi Barnes in the near future.  She tells me she will see Dr. Marlou Barnes this summer.  She should resume mammography this summer at [place].  Mammograms can be scheduled by Dr. Marlou Barnes or Dr. Lindi Barnes.  I've not scheduled the patient for a formal follow-up visit.

## 2014-07-24 ENCOUNTER — Other Ambulatory Visit: Payer: Self-pay | Admitting: Hematology and Oncology

## 2014-07-28 ENCOUNTER — Telehealth: Payer: Self-pay | Admitting: Hematology and Oncology

## 2014-07-28 ENCOUNTER — Ambulatory Visit (HOSPITAL_BASED_OUTPATIENT_CLINIC_OR_DEPARTMENT_OTHER): Payer: BLUE CROSS/BLUE SHIELD | Admitting: Hematology and Oncology

## 2014-07-28 VITALS — BP 112/73 | HR 65 | Temp 98.4°F | Resp 18 | Ht 64.0 in | Wt 164.1 lb

## 2014-07-28 DIAGNOSIS — C50412 Malignant neoplasm of upper-outer quadrant of left female breast: Secondary | ICD-10-CM

## 2014-07-28 DIAGNOSIS — Z170 Estrogen receptor positive status [ER+]: Secondary | ICD-10-CM

## 2014-07-28 DIAGNOSIS — D0512 Intraductal carcinoma in situ of left breast: Secondary | ICD-10-CM

## 2014-07-28 NOTE — Telephone Encounter (Signed)
per pof ot sch pt appt-gave pt coppy of sch

## 2014-07-28 NOTE — Assessment & Plan Note (Addendum)
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  Fatigue initially that got better with more activity and exercise 2.  Denies hot flashes or myalgias or any other toxicities.  Breast cancer surveillance: 1. Breast exam 07/28/2014 is normal 2. Patient will need a mammogram August 2016   Return to clinic in 6 months for follow-up

## 2014-07-28 NOTE — Progress Notes (Signed)
Patient Care Team: Candace Wyline Copas, MD as PCP - General (Family Medicine)  DIAGNOSIS: Breast cancer of upper-outer quadrant of left female breast   Staging form: Breast, AJCC 7th Edition     Clinical: Stage 0 (Tis, N0, cM0) - Unsigned       Staging comments: Staged at breast conference on 01/29/14.      Pathologic: No stage assigned - Unsigned   SUMMARY OF ONCOLOGIC HISTORY:   Breast cancer of upper-outer quadrant of left female breast   01/27/2014 Initial Diagnosis High-grade DCIS with necrosis ER/PR positive   01/27/2014 Breast MRI Left breast subareolar region 1.6 x 1.6 x 2.1 cm   02/12/2014 Surgery Left breast lumpectomy DCIS with calcifications high-grade 2.5 cm margins negative reexcision of the margins benign ER 100%, PR 90%   04/01/2014 - 05/02/2014 Radiation Therapy Adjuvant radiation therapy   05/13/2014 -  Anti-estrogen oral therapy Tamoxifen 20 mg daily    CHIEF COMPLIANT:  Follow-up on tamoxifen  INTERVAL HISTORY: Erin Barnes is a  59 year old with above-mentioned history of DCIS involving left breast status post lumpectomy and radiation currently on tamoxifen started December 2015. She is here for a toxicity check. Initially she felt very fatigued but slowly her symptoms improved. She is physically exercising and staying healthy and active. She has loss of libido from stopping estrogen therapy and would like to know more about improving on this.  REVIEW OF SYSTEMS:   Constitutional: Denies fevers, chills or abnormal weight loss Eyes: Denies blurriness of vision Ears, nose, mouth, throat, and face: Denies mucositis or sore throat Respiratory: Denies cough, dyspnea or wheezes Cardiovascular: Denies palpitation, chest discomfort or lower extremity swelling Gastrointestinal:  Denies nausea, heartburn or change in bowel habits Skin: Denies abnormal skin rashes Lymphatics: Denies new lymphadenopathy or easy bruising Neurological:Denies numbness, tingling or new  weaknesses Behavioral/Psych: Mood is stable, no new changes  Breast:  denies any pain or lumps or nodules in either breasts All other systems were reviewed with the patient and are negative.  I have reviewed the past medical history, past surgical history, social history and family history with the patient and they are unchanged from previous note.  ALLERGIES:  has No Known Allergies.  MEDICATIONS:  Current Outpatient Prescriptions  Medication Sig Dispense Refill  . ALPRAZolam (XANAX) 0.5 MG tablet Take 0.5 mg by mouth at bedtime as needed for anxiety.    . Biotin 5000 MCG CAPS Take 1 capsule by mouth daily.    . celecoxib (CELEBREX) 200 MG capsule Take 200 mg by mouth 2 (two) times daily.    . chlorpheniramine-HYDROcodone (TUSSIONEX) 10-8 MG/5ML LQCR     . escitalopram (LEXAPRO) 20 MG tablet Take 20 mg by mouth daily.    . fluorouracil (EFUDEX) 5 % cream     . hydrocortisone 2.5 % cream     . Milk Thistle 200 MG CAPS Take 100 mg by mouth 3 (three) times daily.    . nebivolol (BYSTOLIC) 10 MG tablet Take 10 mg by mouth daily.    . tamoxifen (NOLVADEX) 20 MG tablet TAKE 1 TABLET BY MOUTH EVERY DAY 30 tablet 1  . thyroid (ARMOUR) 30 MG tablet Take 30 mg by mouth daily before breakfast.    . traMADol (ULTRAM) 50 MG tablet Take 50 mg by mouth every 6 (six) hours as needed.    . zaleplon (SONATA) 10 MG capsule Take 10 mg by mouth at bedtime as needed for sleep.     No current facility-administered medications for this  visit.    PHYSICAL EXAMINATION: ECOG PERFORMANCE STATUS: 1 - Symptomatic but completely ambulatory  Filed Vitals:   07/28/14 1043  BP: 112/73  Pulse: 65  Temp: 98.4 F (36.9 C)  Resp: 18   Filed Weights   07/28/14 1043  Weight: 164 lb 1.6 oz (74.435 kg)    GENERAL:alert, no distress and comfortable SKIN: skin color, texture, turgor are normal, no rashes or significant lesions EYES: normal, Conjunctiva are pink and non-injected, sclera clear OROPHARYNX:no  exudate, no erythema and lips, buccal mucosa, and tongue normal  NECK: supple, thyroid normal size, non-tender, without nodularity LYMPH:  no palpable lymphadenopathy in the cervical, axillary or inguinal LUNGS: clear to auscultation and percussion with normal breathing effort HEART: regular rate & rhythm and no murmurs and no lower extremity edema ABDOMEN:abdomen soft, non-tender and normal bowel sounds Musculoskeletal:no cyanosis of digits and no clubbing  NEURO: alert & oriented x 3 with fluent speech, no focal motor/sensory deficits  LABORATORY DATA:  I have reviewed the data as listed   Chemistry      Component Value Date/Time   NA 138 01/29/2014 1209   K 4.3 01/29/2014 1209   CO2 26 01/29/2014 1209   BUN 6.9* 01/29/2014 1209   CREATININE 0.8 01/29/2014 1209      Component Value Date/Time   CALCIUM 9.5 01/29/2014 1209   ALKPHOS 70 01/29/2014 1209   AST 44* 01/29/2014 1209   ALT 46 01/29/2014 1209   BILITOT 0.43 01/29/2014 1209       Lab Results  Component Value Date   WBC 8.3 01/29/2014   HGB 14.4 01/29/2014   HCT 42.7 01/29/2014   MCV 98.5 01/29/2014   PLT 281 01/29/2014   NEUTROABS 6.5 01/29/2014    ASSESSMENT & PLAN:  Breast cancer of upper-outer quadrant of left female breast DCIS left breast status post lumpectomy 02/12/2014 ER/PR positive status post radiation currently on tamoxifen started January 2016  Tamoxifen toxicities: 1.  Fatigue initially that got better with more activity and exercise 2.  Denies hot flashes or myalgias or any other toxicities. 3.  Decreased libido : I provided her information on pelvic floor and intimacy class.  Breast cancer surveillance: 1. Breast exam 07/28/2014 is normal 2. Patient will need a mammogram August 2016   Return to clinic in 6 months for follow-up   No orders of the defined types were placed in this encounter.   The patient has a good understanding of the overall plan. she agrees with it. She will call  with any problems that may develop before her next visit here.   Rulon Eisenmenger, MD

## 2014-08-21 ENCOUNTER — Other Ambulatory Visit: Payer: Self-pay | Admitting: Hematology and Oncology

## 2014-08-21 NOTE — Telephone Encounter (Signed)
Last OV 06/2014.  Next OV 01/2015.  Chart reviewed

## 2014-09-19 ENCOUNTER — Encounter: Payer: Self-pay | Admitting: Nurse Practitioner

## 2014-09-19 ENCOUNTER — Ambulatory Visit (INDEPENDENT_AMBULATORY_CARE_PROVIDER_SITE_OTHER): Payer: BLUE CROSS/BLUE SHIELD | Admitting: Nurse Practitioner

## 2014-09-19 VITALS — BP 118/74 | HR 76 | Ht 64.0 in | Wt 168.0 lb

## 2014-09-19 DIAGNOSIS — Z0000 Encounter for general adult medical examination without abnormal findings: Secondary | ICD-10-CM | POA: Diagnosis not present

## 2014-09-19 DIAGNOSIS — D0592 Unspecified type of carcinoma in situ of left breast: Secondary | ICD-10-CM

## 2014-09-19 DIAGNOSIS — N952 Postmenopausal atrophic vaginitis: Secondary | ICD-10-CM | POA: Diagnosis not present

## 2014-09-19 DIAGNOSIS — Z01419 Encounter for gynecological examination (general) (routine) without abnormal findings: Secondary | ICD-10-CM

## 2014-09-19 NOTE — Progress Notes (Signed)
Patient ID: Erin Barnes, female   DOB: [birth year], 59 y.o.   MRN: [id number] 59 y.o. G0P0. Single Caucasian Fe here for annual exam.  New diagnose 11/2013 with left breast cancer.  Lumpectomy, radiation and now on Tamoxifen.  Same partner since 86.  She is having an increase in vaso symptoms since off HRT and an increase in vaginal dryness.  Patient's last menstrual period was 12/28/2009.          Sexually active: No.  The current method of family planning is post menopausal status.    Exercising: Yes.    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 She does not have any smokeless tobacco history on file. She reports that she drinks alcohol. She reports that she does not use illicit drugs.  Past Medical History  Diagnosis Date  . Wears contact lenses   . Anxiety   . Depression   . Hypertension   . Arthritis   . Hypothyroidism   . Breast cancer 01/23/14    left  . S/P radiation therapy 04/01/2014 through 05/12/2014                                                       Left breast 4800 cGy in 24 sessions, left breast boost 1000 cGy in 5 sessions                          . Postmenopausal HRT (hormone replacement therapy) 2011- 2015    Bio- identical HRT @ [facility]    Past Surgical History  Procedure Laterality Date  . Colonoscopy    . Wisdom tooth extraction    . Urethral dilation    . Breast lumpectomy with radioactive seed localization Left 02/12/2014    Procedure: LEFT BREAST RADIOACTIVE SEED LOCALIZATION LUMPECTOMY ;  Surgeon: Autumn Messing III, MD;  Location: [facility];  Service: General;  Laterality: Left;    Current Outpatient Prescriptions  Medication Sig Dispense Refill  . ALPRAZolam (XANAX) 0.5 MG  tablet Take 0.5 mg by mouth at bedtime as needed for anxiety.    . Biotin 5000 MCG CAPS Take 1 capsule by mouth daily.    . celecoxib (CELEBREX) 200 MG capsule Take 200 mg by mouth 2 (two) times daily.    . chlorpheniramine-HYDROcodone (TUSSIONEX) 10-8 MG/5ML LQCR     . escitalopram (LEXAPRO) 20 MG tablet Take 20 mg by mouth daily.    . fluorouracil (EFUDEX) 5 % cream     . hydrocortisone 2.5 % cream     . Milk Thistle 200 MG CAPS Take 100 mg by mouth 3 (three) times daily.    . nebivolol (BYSTOLIC) 10 MG tablet Take 10 mg by mouth daily.    . tamoxifen (NOLVADEX) 20 MG tablet TAKE 1 TABLET BY MOUTH EVERY DAY 90 tablet 1  . thyroid (ARMOUR) 30 MG tablet Take 30 mg by mouth daily before breakfast.    . traMADol (ULTRAM) 50 MG tablet Take 50 mg by mouth every 6 (six)  hours as needed.    . zaleplon (SONATA) 10 MG capsule Take 10 mg by mouth at bedtime as needed for sleep.     No current facility-administered medications for this visit.    Family History  Problem Relation Age of Onset  . Prostate cancer Father 52  . Arthritis Mother   . Osteoporosis Mother     hip fracture    ROS:  Pertinent items are noted in HPI.  Otherwise, a comprehensive ROS was negative.  Exam:   BP 118/74 mmHg  Pulse 76  Ht 5\' 4"  (1.626 m)  Wt 168 lb (76.204 kg)  BMI 28.82 kg/m2  LMP 12/28/2009 Height: 5\' 4"  (162.6 cm) Ht Readings from Last 3 Encounters:  09/19/14 5\' 4"  (1.626 m)  07/28/14 5\' 4"  (1.626 m)  02/27/14 5\' 4"  (1.626 m)    General appearance: alert, cooperative and appears stated age Head: Normocephalic, without obvious abnormality, atraumatic Neck: no adenopathy, supple, symmetrical, trachea midline and thyroid normal to inspection and palpation Lungs: clear to auscultation bilaterally Breasts: normal appearance, no masses or tenderness, surgical and radiation changes on left breast Heart: regular rate and rhythm Abdomen: soft, non-tender; no masses,  no organomegaly Extremities:  extremities normal, atraumatic, no cyanosis or edema Skin: Skin color, texture, turgor normal. No rashes or lesions Lymph nodes: Cervical, supraclavicular, and axillary nodes normal. No abnormal inguinal nodes palpated Neurologic: Grossly normal   Pelvic: External genitalia:  no lesions              Urethra:  normal appearing urethra with no masses, tenderness or lesions              Bartholin's and Skene's: normal                 Vagina: very atrophic appearing vagina with pale color and discharge, no lesions              Cervix: anteverted              Pap taken: Yes.   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

## 2014-09-19 NOTE — Patient Instructions (Addendum)

## 2014-09-21 NOTE — Progress Notes (Signed)
Encounter reviewed by Dr. Rayden Dock Silva.  

## 2014-09-24 LAB — IPS PAP TEST WITH HPV

## 2015-01-30 ENCOUNTER — Ambulatory Visit: Payer: BLUE CROSS/BLUE SHIELD | Admitting: Hematology and Oncology

## 2015-01-30 NOTE — Assessment & Plan Note (Signed)
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 Fatigue initially that got better with more activity and exercise 2. Denies hot flashes or myalgias or any other toxicities. 3. Decreased libido : I provided her information on pelvic floor and intimacy class.  Breast cancer surveillance: 1. Breast exam 01/30/2015 is normal 2. Mammogram 01/12/2015 is normal breast density category C  Return to clinic in 6 months for follow-up

## 2015-03-21 ENCOUNTER — Other Ambulatory Visit: Payer: Self-pay | Admitting: Hematology and Oncology

## 2015-03-23 ENCOUNTER — Other Ambulatory Visit: Payer: Self-pay | Admitting: *Deleted

## 2015-03-23 DIAGNOSIS — C50412 Malignant neoplasm of upper-outer quadrant of left female breast: Secondary | ICD-10-CM

## 2015-03-23 MED ORDER — TAMOXIFEN CITRATE 20 MG PO TABS: 20.0000 mg | ORAL_TABLET | Freq: Every day | ORAL | Status: DC

## 2015-03-24 ENCOUNTER — Telehealth: Payer: Self-pay | Admitting: Hematology and Oncology

## 2015-03-24 NOTE — Telephone Encounter (Signed)
patient called in to reschedule her missed appointment

## 2015-04-06 ENCOUNTER — Ambulatory Visit: Payer: BLUE CROSS/BLUE SHIELD | Admitting: Hematology and Oncology

## 2015-04-06 NOTE — Assessment & Plan Note (Deleted)
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 Fatigue initially that got better with more activity and exercise 2. Denies hot flashes or myalgias or any other toxicities. 3. Decreased libido : I provided her information on pelvic floor and intimacy class.  Breast cancer surveillance: 1. Breast exam 04/06/2015 is without any palpable abnormalities 2. Mammogram 01/12/2015 benign, breast density category C  Return to clinic in 6 months for follow-up

## 2015-05-01 ENCOUNTER — Telehealth: Payer: Self-pay | Admitting: Hematology and Oncology

## 2015-05-01 NOTE — Telephone Encounter (Signed)
Patient had called and left a message to r/s missed appointments,done and left a message with appointments

## 2015-05-11 ENCOUNTER — Ambulatory Visit: Payer: BLUE CROSS/BLUE SHIELD | Admitting: Hematology and Oncology

## 2015-05-18 ENCOUNTER — Ambulatory Visit (HOSPITAL_BASED_OUTPATIENT_CLINIC_OR_DEPARTMENT_OTHER): Payer: BLUE CROSS/BLUE SHIELD | Admitting: Hematology and Oncology

## 2015-05-18 ENCOUNTER — Encounter: Payer: Self-pay | Admitting: Hematology and Oncology

## 2015-05-18 ENCOUNTER — Telehealth: Payer: Self-pay | Admitting: Hematology and Oncology

## 2015-05-18 VITALS — BP 123/84 | HR 74 | Temp 98.2°F | Resp 18 | Ht 64.0 in | Wt 168.9 lb

## 2015-05-18 DIAGNOSIS — C50412 Malignant neoplasm of upper-outer quadrant of left female breast: Secondary | ICD-10-CM

## 2015-05-18 DIAGNOSIS — R6882 Decreased libido: Secondary | ICD-10-CM

## 2015-05-18 DIAGNOSIS — D0512 Intraductal carcinoma in situ of left breast: Secondary | ICD-10-CM | POA: Diagnosis not present

## 2015-05-18 MED ORDER — TAMOXIFEN CITRATE 20 MG PO TABS: 20.0000 mg | ORAL_TABLET | Freq: Every day | ORAL | Status: DC

## 2015-05-18 NOTE — Assessment & Plan Note (Signed)
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 Fatigue initially that got better with more activity and exercise 2. Denies hot flashes or myalgias or any other toxicities. 3. Decreased libido : I provided her information on pelvic floor and intimacy class.  Breast cancer surveillance: 1. Breast exam 05/18/2015 is without any palpable abnormalities 2. Mammogram 01/12/2015 benign, breast density category C  Return to clinic in 6 months for follow-up

## 2015-05-18 NOTE — Telephone Encounter (Signed)
Appointments made and avs printed for patient °

## 2015-05-18 NOTE — Progress Notes (Signed)
Patient Care Team: Carol Ada, MD as PCP - General (Family Medicine)  DIAGNOSIS: Breast cancer of upper-outer quadrant of left female breast [facility])   Staging form: Breast, AJCC 7th Edition     Clinical: Stage 0 (Tis, N0, cM0) - Unsigned       Staging comments: Staged at breast conference on 01/29/14.      Pathologic: No stage assigned - Unsigned   SUMMARY OF ONCOLOGIC HISTORY:   Breast cancer of upper-outer quadrant of left female breast ([place])   01/27/2014 Initial Diagnosis High-grade DCIS with necrosis ER/PR positive   01/27/2014 Breast MRI Left breast subareolar region 1.6 x 1.6 x 2.1 cm   02/12/2014 Surgery Left breast lumpectomy DCIS with calcifications high-grade 2.5 cm margins negative reexcision of the margins benign ER 100%, PR 90%   04/01/2014 - 05/02/2014 Radiation Therapy Adjuvant radiation therapy   05/13/2014 -  Anti-estrogen oral therapy Tamoxifen 20 mg daily    CHIEF COMPLIANT: follow-up on tamoxifen  INTERVAL HISTORY: Erin Barnes is a 59-year-old with above-mentioned history of high-grade DCIS was currently on adjuvant tamoxifen therapy. She is tolerating it very well without any major side effects or concerns. She is slightly concerned about weight gain. She knows that it is because she is not exercising as regularly.denies any hot flashes or myalgias. Denies any lumps or nodules in breast.  REVIEW OF SYSTEMS:   Constitutional: Denies fevers, chills or abnormal weight loss Eyes: Denies blurriness of vision Ears, nose, mouth, throat, and face: Denies mucositis or sore throat Respiratory: Denies cough, dyspnea or wheezes Cardiovascular: Denies palpitation, chest discomfort or lower extremity swelling Gastrointestinal:  Denies nausea, heartburn or change in bowel habits Skin: Denies abnormal skin rashes Lymphatics: Denies new lymphadenopathy or easy bruising Neurological:Denies numbness, tingling or new weaknesses Behavioral/Psych: Mood is stable, no new changes    Breast:  denies any pain or lumps or nodules in either breasts All other systems were reviewed with the patient and are negative.  I have reviewed the past medical history, past surgical history, social history and family history with the patient and they are unchanged from previous note.  ALLERGIES:  has No Known Allergies.  MEDICATIONS:  Current Outpatient Prescriptions  Medication Sig Dispense Refill  . ALPRAZolam (XANAX) 0.5 MG tablet Take 0.5 mg by mouth at bedtime as needed for anxiety.    . Biotin 5000 MCG CAPS Take 1 capsule by mouth daily.    . celecoxib (CELEBREX) 200 MG capsule Take 200 mg by mouth 2 (two) times daily.    . chlorpheniramine-HYDROcodone (TUSSIONEX) 10-8 MG/5ML LQCR     . escitalopram (LEXAPRO) 20 MG tablet Take 20 mg by mouth daily.    . fluorouracil (EFUDEX) 5 % cream     . hydrocortisone 2.5 % cream     . Milk Thistle 200 MG CAPS Take 100 mg by mouth 3 (three) times daily.    . nebivolol (BYSTOLIC) 10 MG tablet Take 10 mg by mouth daily.    . tamoxifen (NOLVADEX) 20 MG tablet Take 1 tablet (20 mg total) by mouth daily. 90 tablet 0  . thyroid (ARMOUR) 30 MG tablet Take 30 mg by mouth daily before breakfast.    . traMADol (ULTRAM) 50 MG tablet Take 50 mg by mouth every 6 (six) hours as needed.    . zaleplon (SONATA) 10 MG capsule Take 10 mg by mouth at bedtime as needed for sleep.     No current facility-administered medications for this visit.    PHYSICAL  EXAMINATION: ECOG PERFORMANCE STATUS: 1 - Symptomatic but completely ambulatory  Filed Vitals:   05/18/15 1459  BP: 123/84  Pulse: 74  Temp: 98.2 F (36.8 C)  Resp: 18   Filed Weights   05/18/15 1459  Weight: 168 lb 14.4 oz (76.613 kg)    GENERAL:alert, no distress and comfortable SKIN: skin color, texture, turgor are normal, no rashes or significant lesions EYES: normal, Conjunctiva are pink and non-injected, sclera clear OROPHARYNX:no exudate, no erythema and lips, buccal mucosa, and  tongue normal  NECK: supple, thyroid normal size, non-tender, without nodularity LYMPH:  no palpable lymphadenopathy in the cervical, axillary or inguinal LUNGS: clear to auscultation and percussion with normal breathing effort HEART: regular rate & rhythm and no murmurs and no lower extremity edema ABDOMEN:abdomen soft, non-tender and normal bowel sounds Musculoskeletal:no cyanosis of digits and no clubbing  NEURO: alert & oriented x 3 with fluent speech, no focal motor/sensory deficits BREAST: No palpable masses or nodules in either right or left breasts. No palpable axillary supraclavicular or infraclavicular adenopathy no breast tenderness or nipple discharge. (exam performed in the presence of a chaperone)  LABORATORY DATA:  I have reviewed the data as listed   Chemistry      Component Value Date/Time   NA 138 01/29/2014 1209   K 4.3 01/29/2014 1209   CO2 26 01/29/2014 1209   BUN 6.9* 01/29/2014 1209   CREATININE 0.8 01/29/2014 1209      Component Value Date/Time   CALCIUM 9.5 01/29/2014 1209   ALKPHOS 70 01/29/2014 1209   AST 44* 01/29/2014 1209   ALT 46 01/29/2014 1209   BILITOT 0.43 01/29/2014 1209       Lab Results  Component Value Date   WBC 8.3 01/29/2014   HGB 14.4 01/29/2014   HCT 42.7 01/29/2014   MCV 98.5 01/29/2014   PLT 281 01/29/2014   NEUTROABS 6.5 01/29/2014   ASSESSMENT & PLAN:  Breast cancer of upper-outer quadrant of left female breast (Erin Barnes) DCIS left breast status post lumpectomy 02/12/2014 ER/PR positive status post radiation currently on tamoxifen started January 2016  Tamoxifen toxicities: 1. Fatigue initially that got better with more activity and exercise 2. Denies hot flashes or myalgias or any other toxicities. 3. Decreased libido : I provided her information on pelvic floor and intimacy class.  Breast cancer surveillance: 1. Breast exam 05/18/2015 is without any palpable abnormalities 2. Mammogram 01/12/2015 benign, breast density  category C  Return to clinic in one year for follow-up   No orders of the defined types were placed in this encounter.   The patient has a good understanding of the overall plan. she agrees with it. she will call with any problems that may develop before the next visit here.   Rulon Eisenmenger, MD 05/18/2015

## 2015-06-18 ENCOUNTER — Other Ambulatory Visit: Payer: Self-pay | Admitting: Hematology and Oncology

## 2015-06-18 DIAGNOSIS — C50412 Malignant neoplasm of upper-outer quadrant of left female breast: Secondary | ICD-10-CM

## 2015-09-22 ENCOUNTER — Ambulatory Visit (INDEPENDENT_AMBULATORY_CARE_PROVIDER_SITE_OTHER): Payer: BLUE CROSS/BLUE SHIELD | Admitting: Nurse Practitioner

## 2015-09-22 ENCOUNTER — Encounter: Payer: Self-pay | Admitting: Nurse Practitioner

## 2015-09-22 VITALS — BP 128/86 | HR 76 | Resp 16 | Ht 63.75 in | Wt 168.0 lb

## 2015-09-22 DIAGNOSIS — N952 Postmenopausal atrophic vaginitis: Secondary | ICD-10-CM | POA: Diagnosis not present

## 2015-09-22 DIAGNOSIS — Z01419 Encounter for gynecological examination (general) (routine) without abnormal findings: Secondary | ICD-10-CM | POA: Diagnosis not present

## 2015-09-22 DIAGNOSIS — Z0000 Encounter for general adult medical examination without abnormal findings: Secondary | ICD-10-CM

## 2015-09-22 DIAGNOSIS — D0592 Unspecified type of carcinoma in situ of left breast: Secondary | ICD-10-CM | POA: Diagnosis not present

## 2015-09-22 LAB — POCT URINALYSIS DIPSTICK
BILIRUBIN UA: NEGATIVE
Blood, UA: NEGATIVE
Glucose, UA: NEGATIVE
Ketones, UA: NEGATIVE
Leukocytes, UA: NEGATIVE
Nitrite, UA: NEGATIVE
Protein, UA: NEGATIVE
Urobilinogen, UA: NEGATIVE
pH, UA: 7

## 2015-09-22 NOTE — Patient Instructions (Addendum)

## 2015-09-22 NOTE — Progress Notes (Signed)
60 y.o. G0P0000 Single  Caucasian Fe here for annual exam.  No new diagnosis this year.  Still working full time.  Will be seeing oncologist now yearly.  Not SA but dating.  Patient's last menstrual period was 12/28/2009.          Sexually active: No.  The current method of family planning is post menopausal status.    Exercising: Yes.    Walking Smoker:  no  Health Maintenance: Pap:  09/19/14 Neg. HR HPV:neg MMG:  01/12/15 BIRADS2:Benign  Colonoscopy:  07/2006 Diverticulosis - repeat 10 years  BMD:   01/13/14 Osteopenia TDaP:  UTD w/ PCP Shingles: None Pneumonia: None Hep C and HIV: None Labs: PCP UA: Negative   reports that she has never smoked. She has never used smokeless tobacco. She reports that she drinks alcohol. She reports that she does not use illicit drugs.  Past Medical History  Diagnosis Date  . Wears contact lenses   . Anxiety   . Depression   . Hypertension   . Arthritis   . Hypothyroidism   . Breast cancer ([place]) 01/23/14    left  . S/P radiation therapy 04/01/2014 through 05/12/2014                                                       Left breast 4800 cGy in 24 sessions, left breast boost 1000 cGy in 5 sessions                          . Postmenopausal HRT (hormone replacement therapy) 2011- 2015    Bio- identical HRT @ [facility]    Past Surgical History  Procedure Laterality Date  . Colonoscopy    . Wisdom tooth extraction    . Urethral dilation    . Breast lumpectomy with radioactive seed localization Left 02/12/2014    Procedure: LEFT BREAST RADIOACTIVE SEED LOCALIZATION LUMPECTOMY ;  Surgeon: Autumn Messing III, MD;  Location: [facility];  Service: General;  Laterality: Left;    Current Outpatient Prescriptions  Medication Sig Dispense Refill  . ALPRAZolam (XANAX) 0.5 MG tablet Take 0.5 mg by mouth at bedtime as needed for anxiety.    . Biotin 5000 MCG CAPS Take 1 capsule by mouth daily.    . celecoxib (CELEBREX) 200 MG capsule Take 200 mg by  mouth 2 (two) times daily.    Marland Kitchen escitalopram (LEXAPRO) 20 MG tablet Take 20 mg by mouth daily.    . Milk Thistle 200 MG CAPS Take 100 mg by mouth 3 (three) times daily.    . nebivolol (BYSTOLIC) 10 MG tablet Take 10 mg by mouth daily.    . tamoxifen (NOLVADEX) 20 MG tablet TAKE 1 TABLET (20 MG TOTAL) BY MOUTH DAILY  -- NEED APPOINTMENT. 90 tablet 3  . thyroid (ARMOUR) 30 MG tablet Take 30 mg by mouth daily before breakfast.    . traMADol (ULTRAM) 50 MG tablet Take 50 mg by mouth every 6 (six) hours as needed.    . zaleplon (SONATA) 10 MG capsule Take 10 mg by mouth at bedtime as needed for sleep.    . hydrocortisone 2.5 % cream Reported on 09/22/2015     No current facility-administered medications for this visit.    Family History  Problem Relation Age  of Onset  . Prostate cancer Father 80  . Arthritis Mother   . Osteoporosis Mother     hip fracture    ROS:  Pertinent items are noted in HPI.  Otherwise, a comprehensive ROS was negative.  Exam:   BP 128/86 mmHg  Pulse 76  Resp 16  Ht 5' 3.75" (1.619 m)  Wt 168 lb (76.204 kg)  BMI 29.07 kg/m2  LMP 12/28/2009 Height: 5' 3.75" (161.9 cm) Ht Readings from Last 3 Encounters:  09/22/15 5' 3.75" (1.619 m)  05/18/15 5\' 4"  (1.626 m)  09/19/14 5\' 4"  (1.626 m)    General appearance: alert, cooperative and appears stated age Head: Normocephalic, without obvious abnormality, atraumatic Neck: no adenopathy, supple, symmetrical, trachea midline and thyroid normal to inspection and palpation Lungs: clear to auscultation bilaterally Breasts: normal appearance, no masses or tenderness, on the right.  Surgical and radiation changes on the left. Heart: regular rate and rhythm Abdomen: soft, non-tender; no masses,  no organomegaly Extremities: extremities normal, atraumatic, no cyanosis or edema Skin: Skin color, texture, turgor normal. No rashes or lesions Lymph nodes: Cervical, supraclavicular, and axillary nodes normal. No abnormal  inguinal nodes palpated Neurologic: Grossly normal   Pelvic: External genitalia:  no lesions              Urethra:  normal appearing urethra with no masses, tenderness or lesions              Bartholin's and Skene's: normal                 Vagina: normal appearing vagina with normal color and discharge, no lesions              Cervix: anteverted              Pap taken: No. 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

## 2015-09-22 NOTE — Progress Notes (Deleted)
60 y.o. No obstetric history on file. Single  {Race/ethnicity:17218} Fe here for annual exam.    Patient's last menstrual period was 12/28/2009.          Sexually active: {yes no:314532}  The current method of family planning is {contraception:315051}.    Exercising: {yes no:314532}  {types:19826} Smoker:  {YES [id number]  Health Maintenance: Pap:  09/19/14 Neg. HR HPV:neg MMG:  01/12/15 BIRADS2:Benign  Colonoscopy:  07/2006 Diverticulosis - repeat 10 years  BMD:   01/13/14 Osteopenia TDaP:   Shingles: *** Pneumonia: *** Hep C and HIV: *** Labs: ***   reports that she has never smoked. She does not have any smokeless tobacco history on file. She reports that she drinks alcohol. She reports that she does not use illicit drugs.  Past Medical History  Diagnosis Date  . Wears contact lenses   . Anxiety   . Depression   . Hypertension   . Arthritis   . Hypothyroidism   . Breast cancer ([place]) 01/23/14    left  . S/P radiation therapy 04/01/2014 through 05/12/2014                                                       Left breast 4800 cGy in 24 sessions, left breast boost 1000 cGy in 5 sessions                          . Postmenopausal HRT (hormone replacement therapy) 2011- 2015    Bio- identical HRT @ [facility]    Past Surgical History  Procedure Laterality Date  . Colonoscopy    . Wisdom tooth extraction    . Urethral dilation    . Breast lumpectomy with radioactive seed localization Left 02/12/2014    Procedure: LEFT BREAST RADIOACTIVE SEED LOCALIZATION LUMPECTOMY ;  Surgeon: Autumn Messing III, MD;  Location: [place];  Service: General;  Laterality: Left;    Current Outpatient Prescriptions  Medication Sig Dispense Refill  . ALPRAZolam (XANAX) 0.5 MG tablet Take 0.5 mg by mouth at bedtime as needed for anxiety.    . Biotin 5000 MCG CAPS Take 1 capsule by mouth daily.    . celecoxib (CELEBREX) 200 MG capsule Take 200 mg by mouth 2 (two) times daily.    .  chlorpheniramine-HYDROcodone (TUSSIONEX) 10-8 MG/5ML LQCR     . escitalopram (LEXAPRO) 20 MG tablet Take 20 mg by mouth daily.    . fluorouracil (EFUDEX) 5 % cream     . hydrocortisone 2.5 % cream     . Milk Thistle 200 MG CAPS Take 100 mg by mouth 3 (three) times daily.    . nebivolol (BYSTOLIC) 10 MG tablet Take 10 mg by mouth daily.    . tamoxifen (NOLVADEX) 20 MG tablet TAKE 1 TABLET (20 MG TOTAL) BY MOUTH DAILY  -- NEED APPOINTMENT. 90 tablet 3  . thyroid (ARMOUR) 30 MG tablet Take 30 mg by mouth daily before breakfast.    . traMADol (ULTRAM) 50 MG tablet Take 50 mg by mouth every 6 (six) hours as needed.    . zaleplon (SONATA) 10 MG capsule Take 10 mg by mouth at bedtime as needed for sleep.     No current facility-administered medications for this visit.    Family History  Problem Relation Age  of Onset  . Prostate cancer Father 83  . Arthritis Mother   . Osteoporosis Mother     hip fracture    ROS:  Pertinent items are noted in HPI.  Otherwise, a comprehensive ROS was negative.  Exam:   LMP 12/28/2009   Ht Readings from Last 3 Encounters:  05/18/15 5\' 4"  (1.626 m)  09/19/14 5\' 4"  (1.626 m)  07/28/14 5\' 4"  (1.626 m)    General appearance: alert, cooperative and appears stated age Head: Normocephalic, without obvious abnormality, atraumatic Neck: no adenopathy, supple, symmetrical, trachea midline and thyroid {EXAM; THYROID:18604} Lungs: clear to auscultation bilaterally Breasts: {Exam; breast:13139::"normal appearance, no masses or tenderness"} Heart: regular rate and rhythm Abdomen: soft, non-tender; no masses,  no organomegaly Extremities: extremities normal, atraumatic, no cyanosis or edema Skin: Skin color, texture, turgor normal. No rashes or lesions Lymph nodes: Cervical, supraclavicular, and axillary nodes normal. No abnormal inguinal nodes palpated Neurologic: Grossly normal   Pelvic: External genitalia:  no lesions              Urethra:  normal appearing  urethra with no masses, tenderness or lesions              Bartholin's and Skene's: normal                 Vagina: normal appearing vagina with normal color and discharge, no lesions              Cervix: {exam; cervix:14595}              Pap taken: {yes no:314532} Bimanual Exam:  Uterus:  {exam; uterus:12215}              Adnexa: {exam; adnexa:12223}               Rectovaginal: Confirms               Anus:  normal sphincter tone, no lesions  Chaperone present: ***  A:  Well Woman with normal exam  P:   Reviewed health and wellness pertinent to exam  Pap smear as above  {plan; gyn:5269::"mammogram","pap smear","return annually or prn"}  An After Visit Summary was printed and given to the patient.

## 2015-09-23 LAB — HIV ANTIBODY (ROUTINE TESTING W REFLEX): HIV 1&2 Ab, 4th Generation: NONREACTIVE

## 2015-09-23 LAB — HEPATITIS C ANTIBODY: HCV AB: NEGATIVE

## 2015-09-25 NOTE — Progress Notes (Signed)
Encounter reviewed by Dr. Marvena Tally Amundson C. Silva.  

## 2015-10-12 ENCOUNTER — Telehealth: Payer: Self-pay | Admitting: Nurse Practitioner

## 2015-10-12 NOTE — Telephone Encounter (Signed)
Patient is called and left message per DPR that we have the copy of BMD done 01/13/14 and it does show Osteopenia at the left hip site.  It is recommended that she repeat in 12/2015.  Dr. Nancy Fetter gave her the original order and can get order from the PCP or [place].  She is advised to CB if any questions.

## 2016-02-05 ENCOUNTER — Telehealth: Payer: Self-pay | Admitting: Nurse Practitioner

## 2016-02-05 NOTE — Telephone Encounter (Signed)
Please let pt know that BMD done on 02/02/16 shows a T Score at the spine of -1.60, right hip neck at -1.30; left hip neck at -1.60.  The lowest marker at the left hip but also the spine falls in the Osteopenic range.  In comparison with previous study 01/13/14 there is a loss at spine and right hip.  The spine had 17% loss. The FRAX score for major fracture in 10 yrs is 19% (goal is <20%);  The FRAX score for hip fracture in 10 yrs is 1.3% (goal is <3%).  Her risk for fracture is raised secondary to history of breast cancer treated with radiation, [place] of Osteoporosis, and maternal parent with a hip fracture.  She also takes thyroid medication.  She currently walks - but she needs to increase this to 4 times a week.  She needs to do upper body weight training at least 3-4 times a week.  Continue calcium and Vit D support.  If no recent Vit D, then we should check this - maybe done at PCP.  Dr. Hal Hope Smith's office may have already contacted her about BMD results.

## 2016-02-11 NOTE — Telephone Encounter (Signed)
Return call from patient.  She states she did receive a call from PCP, but they were not very informative.  Reviewed results and recommendations with patient.  Patient voices good understanding of all labs and is agreeable with plan.  Patient does not think she has had Vitamin D checked recently and would like to have that done.  She declined to schedule at this time as she will be out of town and unable to commit to a date until she returns.  Pt very appreciative of call.  Please enter Vitamin D order.

## 2016-02-11 NOTE — Telephone Encounter (Signed)
I have attempted to contact this patient by phone with the following results: left message to return my call on answering machine (mobile).  Name verified in voicemail, advised call was regarding recent bone density results.  [phone] (Mobile)

## 2016-02-12 ENCOUNTER — Other Ambulatory Visit: Payer: Self-pay | Admitting: Nurse Practitioner

## 2016-02-12 DIAGNOSIS — E559 Vitamin D deficiency, unspecified: Secondary | ICD-10-CM

## 2016-05-05 ENCOUNTER — Telehealth: Payer: Self-pay

## 2016-05-05 NOTE — Progress Notes (Signed)
Left message on vm (per DPR) to advise patient to call back and schedule Lab appt for Vit D. -sco

## 2016-05-05 NOTE — Telephone Encounter (Signed)
-----   Message from Graylon Good, [place] sent at 05/05/2016 10:23 AM EST ----- Please call to schedule Vit D. Thanks. Colletta [place]  ----- Message ----- From: Kem Boroughs, FNP Sent: 04/26/2016   7:57 AM To: Graylon Good, CMA  Please call her and reschedule a Vit D test. ----- Message ----- From: SYSTEM Sent: 04/18/2016  12:05 AM To: Kem Boroughs, FNP

## 2016-05-05 NOTE — Telephone Encounter (Signed)
Left message (per DPR) on VM to advised patient to call the office to schedule Lab appointment for Vit D. -sco

## 2016-05-10 NOTE — Telephone Encounter (Signed)
Left message (per DPR) on vm to advised patient to call the office to schedule Lab appointment for Vit D. -sco

## 2016-05-13 ENCOUNTER — Encounter: Payer: Self-pay | Admitting: *Deleted

## 2016-05-16 ENCOUNTER — Ambulatory Visit: Payer: BLUE CROSS/BLUE SHIELD | Admitting: Hematology and Oncology

## 2016-05-16 NOTE — Assessment & Plan Note (Deleted)
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 Fatigue initially that got better with more activity and exercise 2. Denies hot flashes or myalgias or any other toxicities. 3. Decreased libido : I provided her information on pelvic floor and intimacy class.  Breast cancer surveillance: 1. Breast exam 05/16/2016 is without any palpable abnormalities 2. Mammogram 02/03/2016 at [facility] is benign, breast density category C  Return to clinic in one year for follow-up

## 2016-05-17 NOTE — Telephone Encounter (Signed)
Lab reminder letter printed and mailed to patient on 05/13/16.  Closing encounter.

## 2016-05-26 ENCOUNTER — Other Ambulatory Visit: Payer: BLUE CROSS/BLUE SHIELD

## 2016-05-26 ENCOUNTER — Other Ambulatory Visit: Payer: Self-pay

## 2016-05-26 DIAGNOSIS — R7989 Other specified abnormal findings of blood chemistry: Secondary | ICD-10-CM

## 2016-05-27 LAB — VITAMIN D 25 HYDROXY (VIT D DEFICIENCY, FRACTURES): Vit D, 25-Hydroxy: 48 ng/mL (ref 30–100)

## 2016-06-06 ENCOUNTER — Telehealth: Payer: Self-pay | Admitting: Hematology and Oncology

## 2016-06-06 NOTE — Telephone Encounter (Signed)
Patient called to reschedule missed 12/18 appointment.

## 2016-06-16 ENCOUNTER — Ambulatory Visit: Payer: BLUE CROSS/BLUE SHIELD | Admitting: Hematology and Oncology

## 2016-06-16 NOTE — Assessment & Plan Note (Deleted)
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 Fatigue  2. Denies hot flashes or myalgias 3. Decreased libido  Breast cancer surveillance: 1. Breast exam 06/16/2016 is without any palpable abnormalities 2. Mammogram 02/02/2016 benign, breast density category C  Return to clinic in one year for follow-up

## 2016-06-27 ENCOUNTER — Telehealth: Payer: Self-pay | Admitting: Hematology and Oncology

## 2016-06-27 NOTE — Telephone Encounter (Signed)
lvm to inform pt of r/s appt due to inclement weather to 2/12 at 145 pm per LOS

## 2016-07-06 ENCOUNTER — Other Ambulatory Visit: Payer: Self-pay | Admitting: Hematology and Oncology

## 2016-07-06 DIAGNOSIS — C50412 Malignant neoplasm of upper-outer quadrant of left female breast: Secondary | ICD-10-CM

## 2016-07-07 ENCOUNTER — Other Ambulatory Visit: Payer: Self-pay | Admitting: Emergency Medicine

## 2016-07-07 DIAGNOSIS — C50412 Malignant neoplasm of upper-outer quadrant of left female breast: Secondary | ICD-10-CM

## 2016-07-07 MED ORDER — TAMOXIFEN CITRATE 20 MG PO TABS: ORAL_TABLET | ORAL | 3 refills | Status: DC

## 2016-07-11 ENCOUNTER — Ambulatory Visit (HOSPITAL_BASED_OUTPATIENT_CLINIC_OR_DEPARTMENT_OTHER): Payer: BLUE CROSS/BLUE SHIELD | Admitting: Hematology and Oncology

## 2016-07-11 ENCOUNTER — Encounter: Payer: Self-pay | Admitting: Hematology and Oncology

## 2016-07-11 DIAGNOSIS — R6882 Decreased libido: Secondary | ICD-10-CM | POA: Diagnosis not present

## 2016-07-11 DIAGNOSIS — C50412 Malignant neoplasm of upper-outer quadrant of left female breast: Secondary | ICD-10-CM

## 2016-07-11 DIAGNOSIS — Z170 Estrogen receptor positive status [ER+]: Secondary | ICD-10-CM | POA: Diagnosis not present

## 2016-07-11 DIAGNOSIS — D0512 Intraductal carcinoma in situ of left breast: Secondary | ICD-10-CM

## 2016-07-11 NOTE — Assessment & Plan Note (Signed)
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 Fatigue initially that got better with more activity and exercise 2. Denies hot flashes or myalgias or any other toxicities. 3. Decreased libido: I provided her information on pelvic floor and intimacy class.  Breast cancer surveillance: 1. Breast exam 07/11/2016 is without any palpable abnormalities 2. Mammogram 02/02/2016 benign, breast density category C  Return to clinic in one year for follow-up

## 2016-07-11 NOTE — Progress Notes (Signed)
Patient Care Team: Carol Ada, MD as PCP - General (Family Medicine)  DIAGNOSIS:  Encounter Diagnosis  Name Primary?  . Malignant neoplasm of upper-outer quadrant of left breast in female, estrogen receptor positive ([place])     SUMMARY OF ONCOLOGIC HISTORY:   Breast cancer of upper-outer quadrant of left female breast ([place])   01/27/2014 Initial Diagnosis    High-grade DCIS with necrosis ER/PR positive      01/27/2014 Breast MRI    Left breast subareolar region 1.6 x 1.6 x 2.1 cm      02/12/2014 Surgery    Left breast lumpectomy DCIS with calcifications high-grade 2.5 cm margins negative reexcision of the margins benign ER 100%, PR 90%      04/01/2014 - 05/02/2014 Radiation Therapy    Adjuvant radiation therapy      05/13/2014 -  Anti-estrogen oral therapy    Tamoxifen 20 mg daily      CHIEF COMPLIANT: Follow-up on tamoxifen therapy  INTERVAL HISTORY: Erin Barnes is a 61 year old with above-mentioned history left breast DCIS 1 over lumpectomy and radiation is currently on tamoxifen. Initially she had hot flashes and fatigue both of which have improved. She also has issues with libido. It appears to be improving as well. Denies any lumps or nodules in the breasts. Mammograms done in September. She had breast exam by her gynecologist recently which was normal.  REVIEW OF SYSTEMS:   Constitutional: Denies fevers, chills or abnormal weight loss Eyes: Denies blurriness of vision Ears, nose, mouth, throat, and face: Denies mucositis or sore throat Respiratory: Denies cough, dyspnea or wheezes Cardiovascular: Denies palpitation, chest discomfort Gastrointestinal:  Denies nausea, heartburn or change in bowel habits Skin: Denies abnormal skin rashes Lymphatics: Denies new lymphadenopathy or easy bruising Neurological:Denies numbness, tingling or new weaknesses Behavioral/Psych: Mood is stable, no new changes  Extremities: No lower extremity edema Breast:  denies any pain or  lumps or nodules in either breasts All other systems were reviewed with the patient and are negative.  I have reviewed the past medical history, past surgical history, social history and family history with the patient and they are unchanged from previous note.  ALLERGIES:  has No Known Allergies.  MEDICATIONS:  Current Outpatient Prescriptions  Medication Sig Dispense Refill  . ALPRAZolam (XANAX) 0.5 MG tablet Take 0.5 mg by mouth at bedtime as needed for anxiety.    . Biotin 5000 MCG CAPS Take 1 capsule by mouth daily.    . celecoxib (CELEBREX) 200 MG capsule Take 200 mg by mouth 2 (two) times daily.    Marland Kitchen escitalopram (LEXAPRO) 20 MG tablet Take 20 mg by mouth daily.    . hydrocortisone 2.5 % cream Reported on 09/22/2015    . Milk Thistle 200 MG CAPS Take 100 mg by mouth 3 (three) times daily.    . nebivolol (BYSTOLIC) 10 MG tablet Take 10 mg by mouth daily.    . tamoxifen (NOLVADEX) 20 MG tablet Take 1 tablet (20mg s) by mouth daily 90 tablet 3  . thyroid (ARMOUR) 30 MG tablet Take 30 mg by mouth daily before breakfast.    . traMADol (ULTRAM) 50 MG tablet Take 50 mg by mouth every 6 (six) hours as needed.    . zaleplon (SONATA) 10 MG capsule Take 10 mg by mouth at bedtime as needed for sleep.     No current facility-administered medications for this visit.     PHYSICAL EXAMINATION: ECOG PERFORMANCE STATUS: 0 - Asymptomatic  Vitals:   07/11/16 1402  BP: 120/75  Pulse: 79  Resp: 18  Temp: 98.2 F (36.8 C)   Filed Weights   07/11/16 1402  Weight: 177 lb 1.6 oz (80.3 kg)    GENERAL:alert, no distress and comfortable SKIN: skin color, texture, turgor are normal, no rashes or significant lesions EYES: normal, Conjunctiva are pink and non-injected, sclera clear OROPHARYNX:no exudate, no erythema and lips, buccal mucosa, and tongue normal  NECK: supple, thyroid normal size, non-tender, without nodularity LYMPH:  no palpable lymphadenopathy in the cervical, axillary or  inguinal LUNGS: clear to auscultation and percussion with normal breathing effort HEART: regular rate & rhythm and no murmurs and no lower extremity edema ABDOMEN:abdomen soft, non-tender and normal bowel sounds MUSCULOSKELETAL:no cyanosis of digits and no clubbing  NEURO: alert & oriented x 3 with fluent speech, no focal motor/sensory deficits EXTREMITIES: No lower extremity edema BREAST: No palpable masses or nodules in either right or left breasts. No palpable axillary supraclavicular or infraclavicular adenopathy no breast tenderness or nipple discharge. (exam performed in the presence of a chaperone)  LABORATORY DATA:  I have reviewed the data as listed   Chemistry      Component Value Date/Time   NA 138 01/29/2014 1209   K 4.3 01/29/2014 1209   CO2 26 01/29/2014 1209   BUN 6.9 (L) 01/29/2014 1209   CREATININE 0.8 01/29/2014 1209      Component Value Date/Time   CALCIUM 9.5 01/29/2014 1209   ALKPHOS 70 01/29/2014 1209   AST 44 (H) 01/29/2014 1209   ALT 46 01/29/2014 1209   BILITOT 0.43 01/29/2014 1209       Lab Results  Component Value Date   WBC 8.3 01/29/2014   HGB 14.4 01/29/2014   HCT 42.7 01/29/2014   MCV 98.5 01/29/2014   PLT 281 01/29/2014   NEUTROABS 6.5 01/29/2014    ASSESSMENT & PLAN:  Breast cancer of upper-outer quadrant of left female breast ([place]) 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 Fatigue initially that got better with more activity and exercise 2. Denies hot flashes or myalgias or any other toxicities. 3. Decreased libido: I provided her information on pelvic floor and intimacy class.  Breast cancer surveillance: 1. Breast exam 07/11/2016 is without any palpable abnormalities 2. Mammogram 02/02/2016 benign, breast density category C Patient exercises at anytime fitness and appears to be enjoying it. She plans to go  golfing this summer.  Return to clinic in one year for follow-up   I spent 15 minutes talking to the patient of which more than half was spent in counseling and coordination of care.  No orders of the defined types were placed in this encounter.  The patient has a good understanding of the overall plan. she agrees with it. she will call with any problems that may develop before the next visit here.   Rulon Eisenmenger, MD 07/11/16

## 2016-07-17 IMAGING — MR MR BREAST BILATERAL W WO CONTRAST
6 of 13 series · 23 of 48 positions shown · IV contrast (15ml multihance)
Comparison: Images from stereotactic biopsy dated 01/23/2014.

CLINICAL DATA: 58-year-old female with newly diagnosed ductal
carcinoma in situ of the left breast

LABS:  No labs were performed today.
EXAM:
BILATERAL BREAST MRI WITH AND WITHOUT CONTRAST
TECHNIQUE: Multiplanar, multisequence MR images of both breasts were obtained
prior to and following the intravenous administration of 15ml of
MultiHance.

[Series 2: T2 · axial · 3.0mm · 0.47mm/px · z∈[-76,+92]mm · 3 of 57 slices shown]
[im 1/57]
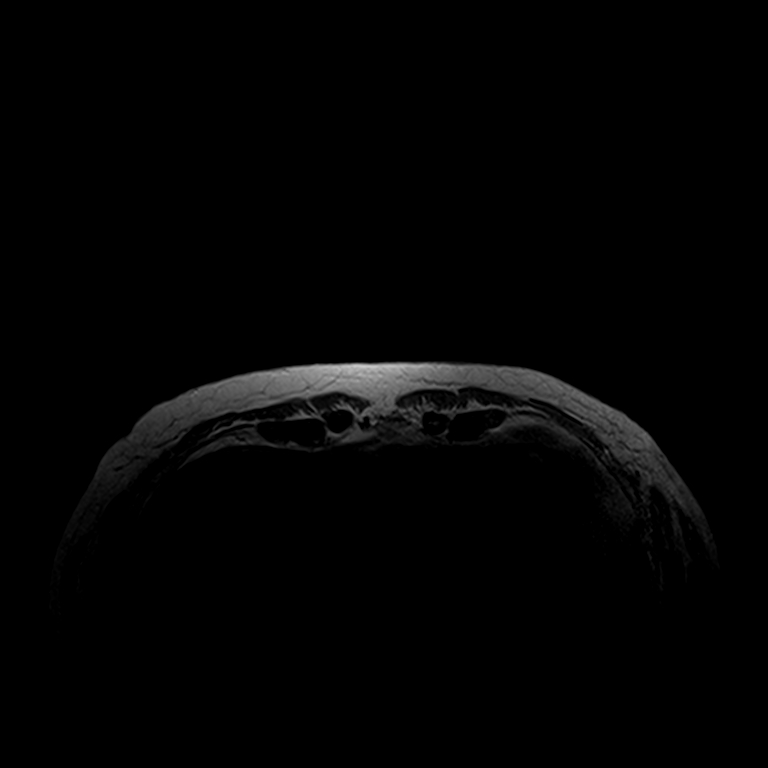
[im 29/57]
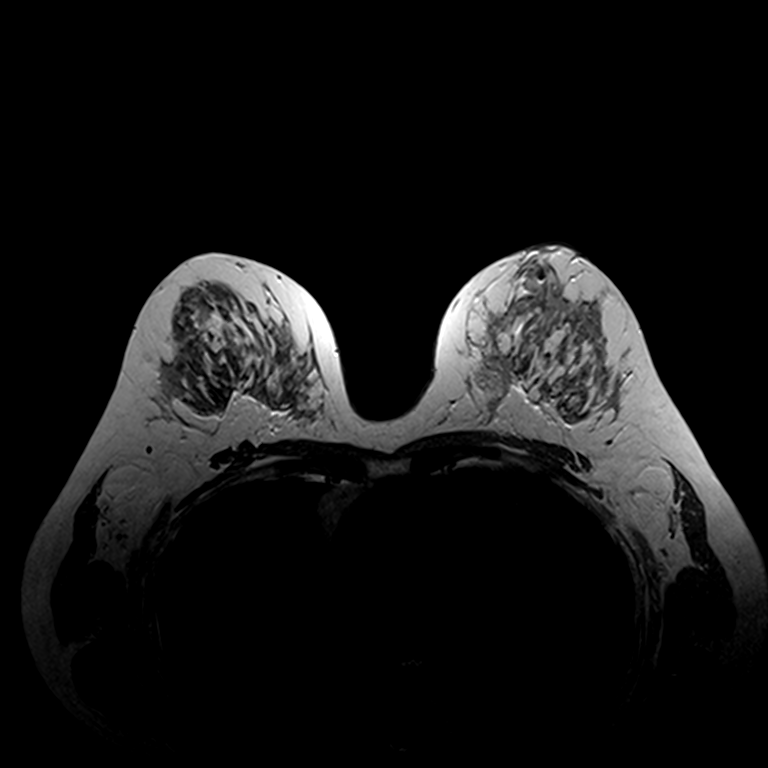
[im 57/57]
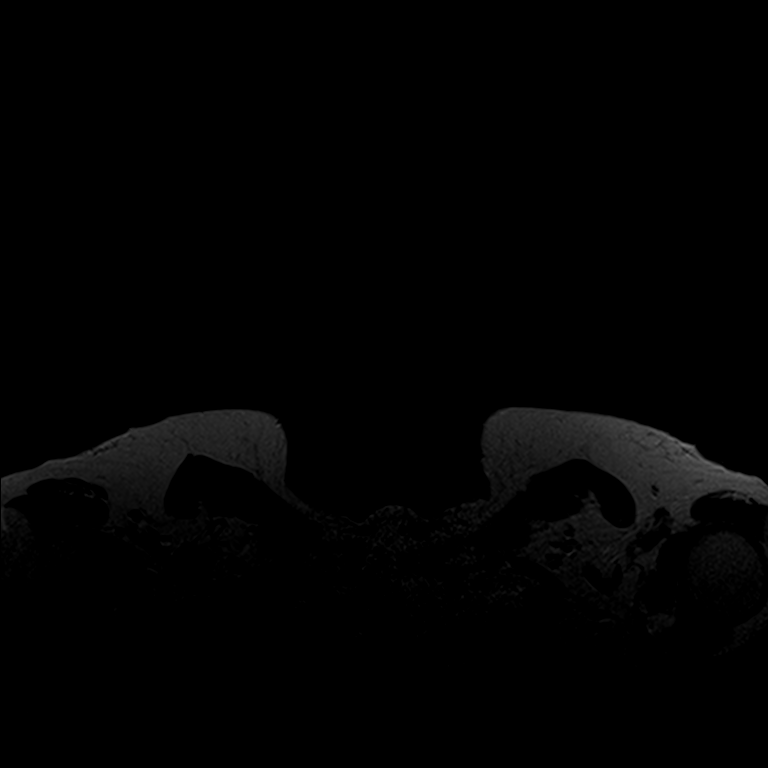

[Series 3: t2_tirm_tra ipat (a-p) · axial · 3.0mm · 0.70mm/px · z∈[-76,+92]mm · 2 of 57 slices shown]
[im 1/57]
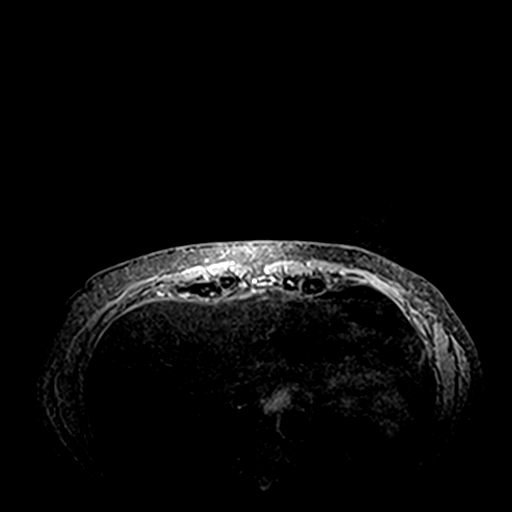
[im 57/57]
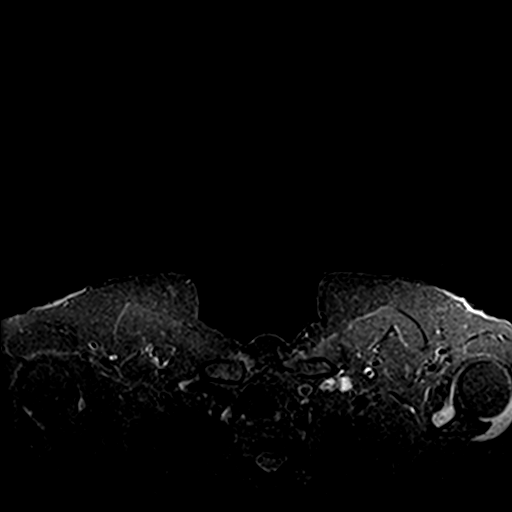

[Series 4: fl3d pre-cm no · axial · non-contrast · 1.2mm · 0.94mm/px · z∈[-88,+103]mm · 5 of 160 slices shown]
[im 1/160]
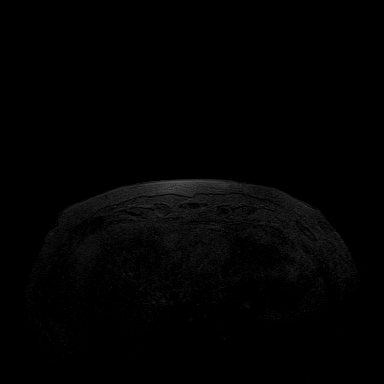
[im 40/160]
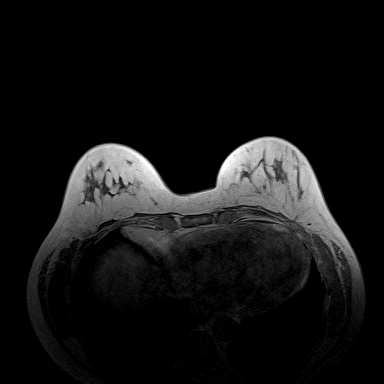
[im 80/160]
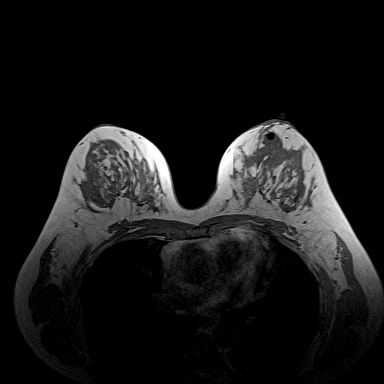
[im 120/160]
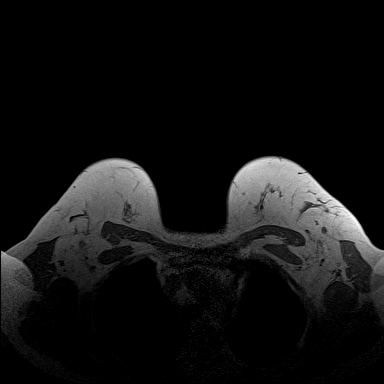
[im 160/160]
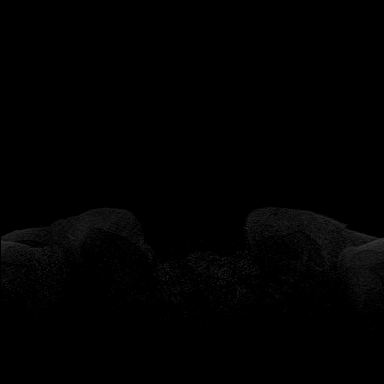

[Series 5: fl3d pre-cm · axial · non-contrast · 1.2mm · 0.94mm/px · z∈[-88,+103]mm · 5 of 160 slices shown]
[im 1/160]
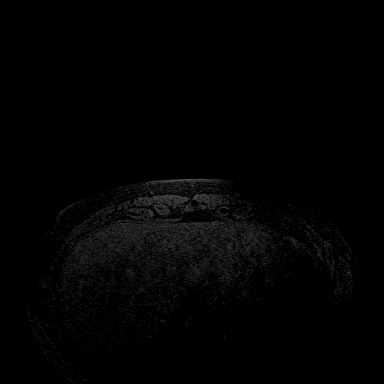
[im 40/160]
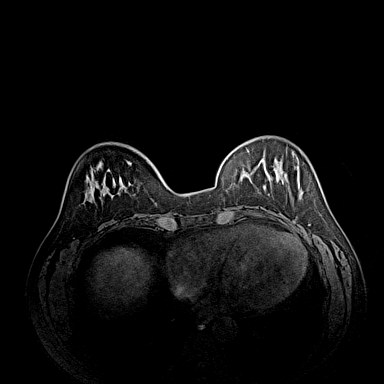
[im 80/160]
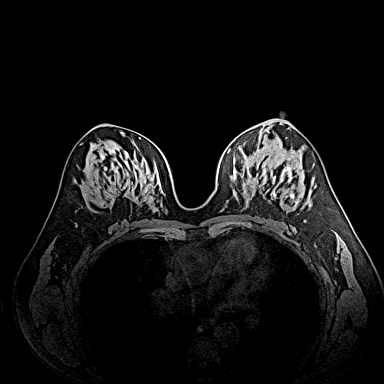
[im 120/160]
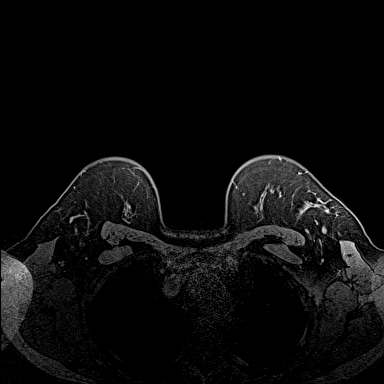
[im 160/160]
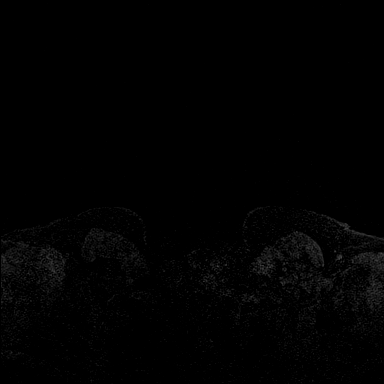

[Series 6: fl3d post immediate · axial · 1.2mm · 0.94mm/px · z∈[-88,+103]mm · 5 of 160 slices shown (1 of 2)]
[im 1/160]
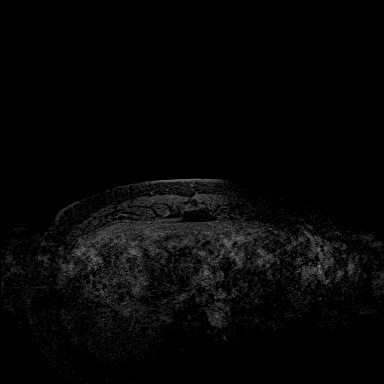
[im 40/160]
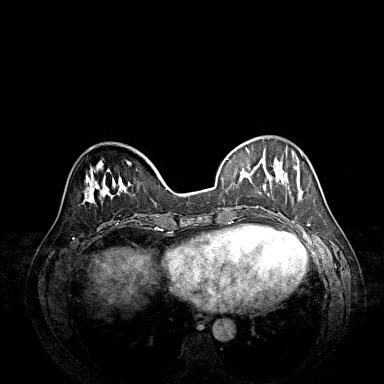
[im 80/160]
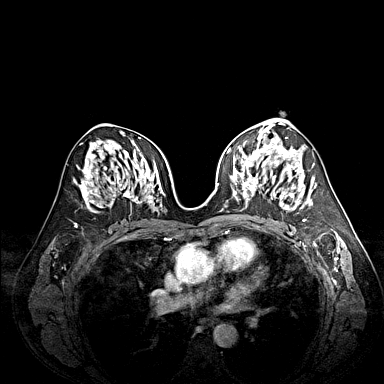
[im 120/160]
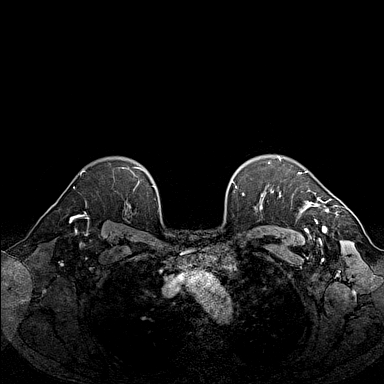
[im 160/160]
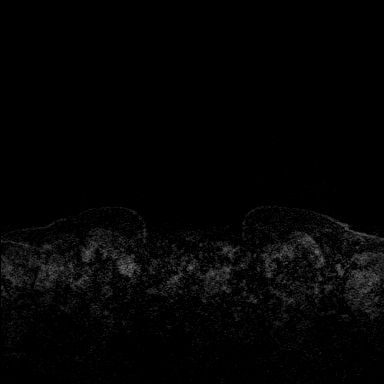

[Series 7: fl3d post immediate · axial · 1.2mm · 0.94mm/px · z∈[-88,+7]mm · 3 of 160 slices shown (2 of 2)]
[im 1/160]
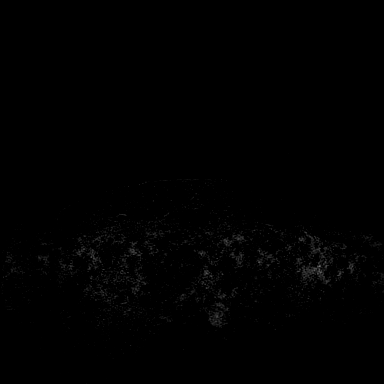
[im 40/160]
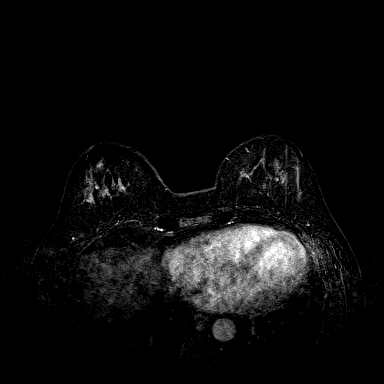
[im 80/160]
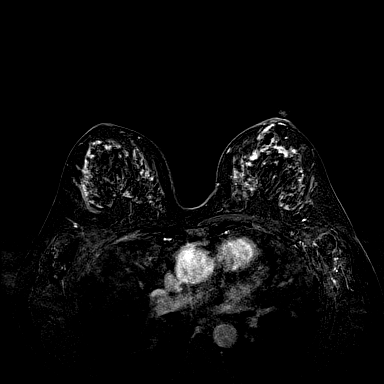

[23 of 48 positions shown; findings below may reference images not displayed]

THREE-DIMENSIONAL MR IMAGE RENDERING ON INDEPENDENT WORKSTATION:

Three-dimensional MR images were rendered by post-processing of the
original MR data on an independent workstation. The
three-dimensional MR images were interpreted, and findings are
reported in the following complete MRI report for this study. Three
dimensional images were evaluated at the independent [employer]
workstation
Mammograms dated 01/17/2014, 01/13/2014, 04/01/2011, and 03/23/2009.
Ultrasound dated 01/17/2014.
FINDINGS: Breast composition: c:  Heterogeneous fibroglandular tissue

Background parenchymal enhancement: Marked

Right breast: No suspicious enhancement identified.

Left breast: Within the anterior, subareolar left breast, there is a
1.6 x 1.6 x 2.1 cm area of post biopsy change. The clip from recent
biopsy is noted along the inferolateral aspect of these changes (see
series 4, image 83). Biopsy demonstrated high-grade ductal carcinoma
in situ. Evaluation of extent of disease is limited by the marked
background enhancement.

Lymph nodes: No abnormal appearing lymph nodes.

Ancillary findings:  None.
IMPRESSION: Known biopsy proven malignancy within the anterior subareolar left
breast. Evaluation of extent of disease is limited by the marked
background enhancement on today's study. The calcifications seen on
mammogram likely represent a better representation of the extent of
disease than the area of post biopsy change seen on MRI.

RECOMMENDATION:
Clinical management of the patient's known malignancy is
recommended.

BI-RADS CATEGORY  6: Known biopsy-proven malignancy.

## 2016-09-26 ENCOUNTER — Ambulatory Visit (INDEPENDENT_AMBULATORY_CARE_PROVIDER_SITE_OTHER): Payer: BLUE CROSS/BLUE SHIELD | Admitting: Nurse Practitioner

## 2016-09-26 ENCOUNTER — Encounter: Payer: Self-pay | Admitting: Nurse Practitioner

## 2016-09-26 VITALS — BP 108/70 | HR 60 | Wt 176.0 lb

## 2016-09-26 DIAGNOSIS — Z01411 Encounter for gynecological examination (general) (routine) with abnormal findings: Secondary | ICD-10-CM

## 2016-09-26 DIAGNOSIS — N952 Postmenopausal atrophic vaginitis: Secondary | ICD-10-CM | POA: Diagnosis not present

## 2016-09-26 DIAGNOSIS — Z170 Estrogen receptor positive status [ER+]: Secondary | ICD-10-CM

## 2016-09-26 DIAGNOSIS — C50412 Malignant neoplasm of upper-outer quadrant of left female breast: Secondary | ICD-10-CM | POA: Diagnosis not present

## 2016-09-26 DIAGNOSIS — E039 Hypothyroidism, unspecified: Secondary | ICD-10-CM

## 2016-09-26 DIAGNOSIS — Z0000 Encounter for general adult medical examination without abnormal findings: Secondary | ICD-10-CM

## 2016-09-26 NOTE — Patient Instructions (Addendum)
EXERCISE AND DIET:  We recommended that you start or continue a regular exercise program for good health. Regular exercise means any activity that makes your heart beat faster and makes you sweat.  We recommend exercising at least 30 minutes per day at least 3 days a week, preferably 4 or 5.  We also recommend a diet low in fat and sugar.  Inactivity, poor dietary choices and obesity can cause diabetes, heart attack, stroke, and kidney damage, among others.    ALCOHOL AND SMOKING:  Women should limit their alcohol intake to no more than 7 drinks/beers/glasses of wine (combined, not each!) per week. Moderation of alcohol intake to this level decreases your risk of breast cancer and liver damage. And of course, no recreational drugs are part of a healthy lifestyle.  And absolutely no smoking or even second hand smoke. Most people know smoking can cause heart and lung diseases, but did you know it also contributes to weakening of your bones? Aging of your skin?  Yellowing of your teeth and nails?  CALCIUM AND VITAMIN D:  Adequate intake of calcium and Vitamin D are recommended.  The recommendations for exact amounts of these supplements seem to change often, but generally speaking 600 mg of calcium (either carbonate or citrate) and 800 units of Vitamin D per day seems prudent. Certain women may benefit from higher intake of Vitamin D.  If you are among these women, your doctor will have told you during your visit.    PAP SMEARS:  Pap smears, to check for cervical cancer or precancers,  have traditionally been done yearly, although recent scientific advances have shown that most women can have pap smears less often.  However, every woman still should have a physical exam from her gynecologist every year. It will include a breast check, inspection of the vulva and vagina to check for abnormal growths or skin changes, a visual exam of the cervix, and then an exam to evaluate the size and shape of the uterus and  ovaries.  And after 62 years of age, a rectal exam is indicated to check for rectal cancers. We will also provide age appropriate advice regarding health maintenance, like when you should have certain vaccines, screening for sexually transmitted diseases, bone density testing, colonoscopy, mammograms, etc.   MAMMOGRAMS:  All women over 8 years old should have a yearly mammogram. Many facilities now offer a "3D" mammogram, which may cost around $50 extra out of pocket. If possible,  we recommend you accept the option to have the 3D mammogram performed.  It both reduces the number of women who will be called back for extra views which then turn out to be normal, and it is better than the routine mammogram at detecting truly abnormal areas.    COLONOSCOPY:  Colonoscopy to screen for colon cancer is recommended for all women at age 62.  We know, you hate the idea of the prep.  We agree, BUT, having colon cancer and not knowing it is worse!!  Colon cancer so often starts as a polyp that can be seen and removed at colonoscopy, which can quite literally save your life!  And if your first colonoscopy is normal and you have no family history of colon cancer, most women don't have to have it again for 10 years.  Once every ten years, you can do something that may end up saving your life, right?  We will be happy to help you get it scheduled when you are ready.  Be sure to check your insurance coverage so you understand how much it will cost.  It may be covered as a preventative service at no cost, but you should check your particular policy.    Bone density test is due after 62/10/2017  Colonoscopy is due now and will need to contact GI  Get date of Shingles vaccine  Get date of TDaP from PCP

## 2016-09-26 NOTE — Progress Notes (Signed)
Encounter reviewed by Dr. Brook Amundson C. Silva.  

## 2016-09-26 NOTE — Progress Notes (Signed)
61 y.o. G0P0000 Single  Caucasian Fe here for annual exam.  No new problems.  Still seeing Oncologist yearly - has 2.5 yrs of Tamoxifen left. Not dating or SA.  Continues to work at [employer].  Patient's last menstrual period was 12/28/2009.          Sexually active: No.  The current method of family planning is post menopausal status.    Exercising: Yes.    Gym/ health club routine includes light weights and elliptical and walking. Smoker:  no  Health Maintenance: Pap: 09/19/14, Negative with neg HR HPV  04/07/11, Negative History of Abnormal Pap: no MMG: 02/02/16, 3D, Bilateral Diagnostic, Bi-Rads 2:  Benign Self Breast exams: yes Colonoscopy: 2007, received recall letter last month BMD: 02/02/16 T Score, -1.60 Spine / -1.30 Right Femur Neck / -1.60 Left Femur Neck TDaP: UTD with PCP Shingles: done summer 2017 Pneumonia: Not indicated due to age Hep C and HIV: 09/22/15 Labs: PCP takes care of all labs   reports that she has never smoked. She has never used smokeless tobacco. She reports that she drinks about 12.0 oz of alcohol per week . She reports that she does not use drugs.  Past Medical History:  Diagnosis Date  . Anxiety   . Arthritis   . Breast cancer ([place]) 01/23/14   left  . Depression   . Hypertension   . Hypothyroidism   . Postmenopausal HRT (hormone replacement therapy) 2011- 2015   Bio- identical HRT @ [facility]  . S/P radiation therapy 04/01/2014 through 05/12/2014                                                      Left breast 4800 cGy in 24 sessions, left breast boost 1000 cGy in 5 sessions                          . Wears contact lenses     Past Surgical History:  Procedure Laterality Date  . BREAST LUMPECTOMY WITH RADIOACTIVE SEED LOCALIZATION Left 02/12/2014   Procedure: LEFT BREAST RADIOACTIVE SEED LOCALIZATION LUMPECTOMY ;  Surgeon: Autumn Messing III, MD;  Location: [facility];  Service: General;  Laterality: Left;  . COLONOSCOPY    . URETHRAL DILATION     . WISDOM TOOTH EXTRACTION      Current Outpatient Prescriptions  Medication Sig Dispense Refill  . ALPRAZolam (XANAX) 0.5 MG tablet Take 0.5 mg by mouth at bedtime as needed for anxiety.    . Biotin 5000 MCG CAPS Take 1 capsule by mouth daily.    . celecoxib (CELEBREX) 200 MG capsule Take 200 mg by mouth 2 (two) times daily.    Marland Kitchen escitalopram (LEXAPRO) 20 MG tablet Take 20 mg by mouth daily.    . hydrocortisone 2.5 % cream Reported on 09/22/2015    . Milk Thistle 200 MG CAPS Take 100 mg by mouth 3 (three) times daily.    . nebivolol (BYSTOLIC) 10 MG tablet Take 10 mg by mouth daily.    . tamoxifen (NOLVADEX) 20 MG tablet Take 1 tablet (20mg s) by mouth daily 90 tablet 3  . thyroid (ARMOUR) 30 MG tablet Take 30 mg by mouth daily before breakfast.    . traMADol (ULTRAM) 50 MG tablet Take 50 mg by mouth every 6 (  six) hours as needed.    . zaleplon (SONATA) 10 MG capsule Take 10 mg by mouth at bedtime as needed for sleep.     No current facility-administered medications for this visit.     Family History  Problem Relation Age of Onset  . Prostate cancer Father 1  . Arthritis Mother   . Osteoporosis Mother     hip fracture    ROS:  Pertinent items are noted in HPI.  Otherwise, a comprehensive ROS was negative.  Exam:   BP 108/70 (BP Location: Right Arm, Patient Position: Sitting, Cuff Size: Large)   Pulse 60   Wt 176 lb (79.8 kg)   LMP 12/28/2009   BMI 30.45 kg/m    Ht Readings from Last 3 Encounters:  07/11/16 5' 3.75" (1.619 m)  09/22/15 5' 3.75" (1.619 m)  05/18/15 5\' 4"  (1.626 m)    General appearance: alert, cooperative and appears stated age Head: Normocephalic, without obvious abnormality, atraumatic Neck: no adenopathy, supple, symmetrical, trachea midline and thyroid normal to inspection and palpation Lungs: clear to auscultation bilaterally Breasts: normal appearance, no masses or tenderness, on the right.  Surgical and radiation changes to the left without new  mass. Heart: regular rate and rhythm Abdomen: soft, non-tender; no masses,  no organomegaly Extremities: extremities normal, atraumatic, no cyanosis or edema Skin: Skin color, texture, turgor normal. No rashes or lesions Lymph nodes: Cervical, supraclavicular, and axillary nodes normal. No abnormal inguinal nodes palpated Neurologic: Grossly normal   Pelvic: External genitalia:  no lesions              Urethra:  normal appearing urethra with no masses, tenderness or lesions              Bartholin's and Skene's: normal                 Vagina: normal appearing vagina with normal color and discharge, no lesions              Cervix: anteverted              Pap taken: Yes.   Bimanual Exam:  Uterus:  normal size, contour, position, consistency, mobility, non-tender              Adnexa: no mass, fullness, tenderness               Rectovaginal: Confirms               Anus:  normal sphincter tone, no lesions  Chaperone present: yes  A:  Well Woman with normal exam   Postmenopausal on HRT - bio identical HRT from 2011 - 2015 S/P left breast cancer 9/16//2015 DCIS high grade spanning 2.5 cm ER/PR positive; treated with lumpectomy and radiation History of depression, hypothyroid, arthritis, DDD of lumbar spine Atrophic vaginitis   P:   Reviewed health and wellness pertinent to exam  Pap smear: yes  Mammogram is due 01/2017  Counseled on breast self exam, mammography screening, adequate intake of calcium and vitamin D, diet and exercise, Kegel's exercises return annually or prn  An After Visit Summary was printed and given to the patient.

## 2016-09-28 LAB — IPS PAP TEST WITH HPV

## 2016-12-27 ENCOUNTER — Telehealth: Payer: Self-pay | Admitting: Obstetrics & Gynecology

## 2016-12-27 NOTE — Telephone Encounter (Signed)
Left message on voicemail to call and reschedule cancelled appointment. Mail letter °

## 2017-07-11 ENCOUNTER — Ambulatory Visit: Payer: BLUE CROSS/BLUE SHIELD | Admitting: Hematology and Oncology

## 2017-07-11 NOTE — Assessment & Plan Note (Deleted)
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 Fatigue initially that got better with more activity and exercise 2. Denies hot flashes or myalgias or any other toxicities. 3. Decreased libido: I provided her information on pelvic floor and intimacy class.  Breast cancer surveillance: 1. Breast exam  07/11/2017 is without any palpable abnormalities 2. Mammogram  03/09/2017, [facility] benign, breast density category C Patient exercises at anytime fitness and appears to be enjoying it. She plans to go golfing this summer.  Return to clinic in one year for follow-up

## 2017-07-19 ENCOUNTER — Other Ambulatory Visit: Payer: Self-pay | Admitting: Hematology and Oncology

## 2017-07-19 DIAGNOSIS — C50412 Malignant neoplasm of upper-outer quadrant of left female breast: Secondary | ICD-10-CM

## 2017-07-20 ENCOUNTER — Other Ambulatory Visit: Payer: Self-pay | Admitting: Orthopedic Surgery

## 2017-07-31 NOTE — Patient Instructions (Signed)
Erin Barnes  07/31/2017   Your procedure is scheduled on: 08-04-17  Report to [facility] Main  Entrance   ARRIVE AT 530 AM  Please note there is a phone at the [employer]. Please call [phone] on that phone. Someone from Short Stay will come and get you from the Main Lobby and take you to Short Stay.  Call this number if you have problems the morning of surgery [phone]    Remember: Do not eat food or drink liquids :After Midnight.     Take these medicines the morning of surgery with A SIP OF WATER: Thyroid, Tamoxifen, nebivolol, Lexapro                                You may not have any metal on your body including hair pins and              piercings  Do not wear jewelry, make-up, lotions, powders or perfumes, deodorant             Do not wear nail polish.  Do not shave  48 hours prior to surgery.     Do not bring valuables to the hospital. [place].  Contacts, dentures or bridgework may not be worn into surgery.  Leave suitcase in the car. After surgery it may be brought to your room.                  Please read over the following fact sheets you were given: _____________________________________________________________________           [facility] - Preparing for Surgery Before surgery, you can play an important role.  Because skin is not sterile, your skin needs to be as free of germs as possible.  You can reduce the number of germs on your skin by washing with CHG (chlorahexidine gluconate) soap before surgery.  CHG is an antiseptic cleaner which kills germs and bonds with the skin to continue killing germs even after washing. Please DO NOT use if you have an allergy to CHG or antibacterial soaps.  If your skin becomes reddened/irritated stop using the CHG and inform your nurse when you arrive at Short Stay. Do not shave (including legs and underarms) for at least 48 hours  prior to the first CHG shower.  You may shave your face/neck. Please follow these instructions carefully:  1.  Shower with CHG Soap the night before surgery and the  morning of Surgery.  2.  If you choose to wash your hair, wash your hair first as usual with your  normal  shampoo.  3.  After you shampoo, rinse your hair and body thoroughly to remove the  shampoo.                           4.  Use CHG as you would any other liquid soap.  You can apply chg directly  to the skin and wash                       Gently with a scrungie or clean washcloth.  5.  Apply the CHG Soap to your body ONLY FROM THE NECK  DOWN.   Do not use on face/ open                           Wound or open sores. Avoid contact with eyes, ears mouth and genitals (private parts).                       Wash face,  Genitals (private parts) with your normal soap.             6.  Wash thoroughly, paying special attention to the area where your surgery  will be performed.  7.  Thoroughly rinse your body with warm water from the neck down.  8.  DO NOT shower/wash with your normal soap after using and rinsing off  the CHG Soap.                9.  Pat yourself dry with a clean towel.            10.  Wear clean pajamas.            11.  Place clean sheets on your bed the night of your first shower and do not  sleep with pets. Day of Surgery : Do not apply any lotions/deodorants the morning of surgery.  Please wear clean clothes to the hospital/surgery center.  FAILURE TO FOLLOW THESE INSTRUCTIONS MAY RESULT IN THE CANCELLATION OF YOUR SURGERY PATIENT SIGNATURE_________________________________  NURSE SIGNATURE__________________________________  ________________________________________________________________________  WHAT IS A BLOOD TRANSFUSION? Blood Transfusion Information  A transfusion is the replacement of blood or some of its parts. Blood is made up of multiple cells which provide different functions.  Red blood cells carry  oxygen and are used for blood loss replacement.  White blood cells fight against infection.  Platelets control bleeding.  Plasma helps clot blood.  Other blood products are available for specialized needs, such as hemophilia or other clotting disorders. BEFORE THE TRANSFUSION  Who gives blood for transfusions?   Healthy volunteers who are fully evaluated to make sure their blood is safe. This is blood bank blood. Transfusion therapy is the safest it has ever been in the practice of medicine. Before blood is taken from a donor, a complete history is taken to make sure that person has no history of diseases nor engages in risky social behavior (examples are intravenous drug use or sexual activity with multiple partners). The donor's travel history is screened to minimize risk of transmitting infections, such as malaria. The donated blood is tested for signs of infectious diseases, such as HIV and hepatitis. The blood is then tested to be sure it is compatible with you in order to minimize the chance of a transfusion reaction. If you or a relative donates blood, this is often done in anticipation of surgery and is not appropriate for emergency situations. It takes many days to process the donated blood. RISKS AND COMPLICATIONS Although transfusion therapy is very safe and saves many lives, the main dangers of transfusion include:   Getting an infectious disease.  Developing a transfusion reaction. This is an allergic reaction to something in the blood you were given. Every precaution is taken to prevent this. The decision to have a blood transfusion has been considered carefully by your caregiver before blood is given. Blood is not given unless the benefits outweigh the risks. AFTER THE TRANSFUSION  Right after receiving a blood transfusion, you will usually feel much better and more energetic.  This is especially true if your red blood cells have gotten low (anemic). The transfusion raises the  level of the red blood cells which carry oxygen, and this usually causes an energy increase.  The nurse administering the transfusion will monitor you carefully for complications. HOME CARE INSTRUCTIONS  No special instructions are needed after a transfusion. You may find your energy is better. Speak with your caregiver about any limitations on activity for underlying diseases you may have. SEEK MEDICAL CARE IF:   Your condition is not improving after your transfusion.  You develop redness or irritation at the intravenous (IV) site. SEEK IMMEDIATE MEDICAL CARE IF:  Any of the following symptoms occur over the next 12 hours:  Shaking chills.  You have a temperature by mouth above 102 F (38.9 C), not controlled by medicine.  Chest, back, or muscle pain.  People around you feel you are not acting correctly or are confused.  Shortness of breath or difficulty breathing.  Dizziness and fainting.  You get a rash or develop hives.  You have a decrease in urine output.  Your urine turns a dark color or changes to pink, red, or brown. Any of the following symptoms occur over the next 10 days:  You have a temperature by mouth above 102 F (38.9 C), not controlled by medicine.  Shortness of breath.  Weakness after normal activity.  The white part of the eye turns yellow (jaundice).  You have a decrease in the amount of urine or are urinating less often.  Your urine turns a dark color or changes to pink, red, or brown. Document Released: 05/13/2000 Document Revised: 08/08/2011 Document Reviewed: 12/31/2007 ExitCare Patient Information 2014 [place].  _______________________________________________________________________  Incentive Spirometer  An incentive spirometer is a tool that can help keep your lungs clear and active. This tool measures how well you are filling your lungs with each breath. Taking long deep breaths may help reverse or decrease the chance of  developing breathing (pulmonary) problems (especially infection) following:  A long period of time when you are unable to move or be active. BEFORE THE PROCEDURE   If the spirometer includes an indicator to show your best effort, your nurse or respiratory therapist will set it to a desired goal.  If possible, sit up straight or lean slightly forward. Try not to slouch.  Hold the incentive spirometer in an upright position. INSTRUCTIONS FOR USE  1. Sit on the edge of your bed if possible, or sit up as far as you can in bed or on a chair. 2. Hold the incentive spirometer in an upright position. 3. Breathe out normally. 4. Place the mouthpiece in your mouth and seal your lips tightly around it. 5. Breathe in slowly and as deeply as possible, raising the piston or the ball toward the top of the column. 6. Hold your breath for 3-5 seconds or for as long as possible. Allow the piston or ball to fall to the bottom of the column. 7. Remove the mouthpiece from your mouth and breathe out normally. 8. Rest for a few seconds and repeat Steps 1 through 7 at least 10 times every 1-2 hours when you are awake. Take your time and take a few normal breaths between deep breaths. 9. The spirometer may include an indicator to show your best effort. Use the indicator as a goal to work toward during each repetition. 10. After each set of 10 deep breaths, practice coughing to be sure your lungs are clear.  If you have an incision (the cut made at the time of surgery), support your incision when coughing by placing a pillow or rolled up towels firmly against it. Once you are able to get out of bed, walk around indoors and cough well. You may stop using the incentive spirometer when instructed by your caregiver.  RISKS AND COMPLICATIONS  Take your time so you do not get dizzy or light-headed.  If you are in pain, you may need to take or ask for pain medication before doing incentive spirometry. It is harder to take a  deep breath if you are having pain. AFTER USE  Rest and breathe slowly and easily.  It can be helpful to keep track of a log of your progress. Your caregiver can provide you with a simple table to help with this. If you are using the spirometer at home, follow these instructions: [place] IF:   You are having difficultly using the spirometer.  You have trouble using the spirometer as often as instructed.  Your pain medication is not giving enough relief while using the spirometer.  You develop fever of 100.5 F (38.1 C) or higher. SEEK IMMEDIATE MEDICAL CARE IF:   You cough up bloody sputum that had not been present before.  You develop fever of 102 F (38.9 C) or greater.  You develop worsening pain at or near the incision site. MAKE SURE YOU:   Understand these instructions.  Will watch your condition.  Will get help right away if you are not doing well or get worse. Document Released: 09/26/2006 Document Revised: 08/08/2011 Document Reviewed: 11/27/2006 [facility] Patient Information 2014 [place], [place].   ________________________________________________________________________

## 2017-08-01 ENCOUNTER — Encounter (HOSPITAL_COMMUNITY)
Admission: RE | Admit: 2017-08-01 | Discharge: 2017-08-01 | Disposition: A | Discharge status: Home / Self Care | Payer: Managed Care, Other (non HMO) | Source: Ambulatory Visit | Attending: Orthopedic Surgery | Admitting: Orthopedic Surgery

## 2017-08-01 ENCOUNTER — Ambulatory Visit (HOSPITAL_COMMUNITY)
Admission: RE | Admit: 2017-08-01 | Discharge: 2017-08-01 | Disposition: A | Discharge status: Home / Self Care | Payer: Managed Care, Other (non HMO) | Source: Ambulatory Visit | Attending: Orthopedic Surgery | Admitting: Orthopedic Surgery

## 2017-08-01 ENCOUNTER — Encounter (HOSPITAL_COMMUNITY): Payer: Self-pay

## 2017-08-01 ENCOUNTER — Other Ambulatory Visit: Payer: Self-pay

## 2017-08-01 DIAGNOSIS — Z01818 Encounter for other preprocedural examination: Secondary | ICD-10-CM

## 2017-08-01 DIAGNOSIS — M1611 Unilateral primary osteoarthritis, right hip: Secondary | ICD-10-CM | POA: Insufficient documentation

## 2017-08-01 LAB — CBC WITH DIFFERENTIAL/PLATELET
BASOS ABS: 0 10*3/uL (ref 0.0–0.1)
BASOS PCT: 1 %
EOS ABS: 0.1 10*3/uL (ref 0.0–0.7)
Eosinophils Relative: 2 %
HEMATOCRIT: 39.4 % (ref 36.0–46.0)
HEMOGLOBIN: 13.7 g/dL (ref 12.0–15.0)
Lymphocytes Relative: 19 %
Lymphs Abs: 1.3 10*3/uL (ref 0.7–4.0)
MCH: 33.9 pg (ref 26.0–34.0)
MCHC: 34.8 g/dL (ref 30.0–36.0)
MCV: 97.5 fL (ref 78.0–100.0)
MONOS PCT: 7 %
Monocytes Absolute: 0.4 10*3/uL (ref 0.1–1.0)
NEUTROS ABS: 4.6 10*3/uL (ref 1.7–7.7)
NEUTROS PCT: 71 %
Platelets: 252 10*3/uL (ref 150–400)
RBC: 4.04 MIL/uL (ref 3.87–5.11)
RDW: 12.1 % (ref 11.5–15.5)
WBC: 6.5 10*3/uL (ref 4.0–10.5)

## 2017-08-01 LAB — COMPREHENSIVE METABOLIC PANEL
ALBUMIN: 3.9 g/dL (ref 3.5–5.0)
ALK PHOS: 67 U/L (ref 38–126)
ALT: 52 U/L (ref 14–54)
ANION GAP: 8 (ref 5–15)
AST: 51 U/L — ABNORMAL HIGH (ref 15–41)
BILIRUBIN TOTAL: 0.5 mg/dL (ref 0.3–1.2)
BUN: 7 mg/dL (ref 6–20)
CALCIUM: 9.6 mg/dL (ref 8.9–10.3)
CO2: 27 mmol/L (ref 22–32)
Chloride: 104 mmol/L (ref 101–111)
Creatinine, Ser: 0.77 mg/dL (ref 0.44–1.00)
GFR calc Af Amer: >60 mL/min (ref >60)
GFR calc non Af Amer: >60 mL/min (ref >60)
Glucose, Bld: 101 mg/dL — ABNORMAL HIGH (ref 65–99)
Potassium: 5.3 mmol/L — ABNORMAL HIGH (ref 3.5–5.1)
SODIUM: 139 mmol/L (ref 135–145)
TOTAL PROTEIN: 7.1 g/dL (ref 6.5–8.1)

## 2017-08-01 LAB — APTT: APTT: 25 s (ref 24–36)

## 2017-08-01 LAB — SURGICAL PCR SCREEN
MRSA, PCR: NEGATIVE
STAPHYLOCOCCUS AUREUS: NEGATIVE

## 2017-08-01 LAB — PROTIME-INR
INR: 1.01
Prothrombin Time: 13.2 seconds (ref 11.4–15.2)

## 2017-08-01 NOTE — Progress Notes (Signed)
Cbc done 08-01-17 faxed via epic to Dr. Berenice Primas

## 2017-08-02 ENCOUNTER — Other Ambulatory Visit (HOSPITAL_COMMUNITY): Payer: Self-pay | Admitting: *Deleted

## 2017-08-02 LAB — ABO/RH: ABO/RH(D): A POS

## 2017-08-02 NOTE — Progress Notes (Signed)
Left patient message on voice mail to return call, urine specimen needed day of surgery

## 2017-08-02 NOTE — Progress Notes (Signed)
Spoke with Juliann Pulse bloom at dr graves office, patient to do ua am of surgery, called patient no answer to inform patient. Order placed in epic

## 2017-08-02 NOTE — Progress Notes (Signed)
Left message with Juliann Pulse bloom to let us know what dr graves would like to do ua not received per sheilia inl ab.

## 2017-08-03 NOTE — Anesthesia Preprocedure Evaluation (Addendum)
Anesthesia Evaluation  Patient identified by MRN, date of birth, ID band Patient awake    Reviewed: Allergy & Precautions, NPO status , Patient's Chart, lab work & pertinent test results  Airway Mallampati: II  TM Distance: >3 FB Neck ROM: Full    Dental no notable dental hx.    Pulmonary neg pulmonary ROS,    Pulmonary exam normal breath sounds clear to auscultation       Cardiovascular hypertension, Pt. on medications and Pt. on home beta blockers Normal cardiovascular exam Rhythm:Regular Rate:Normal     Neuro/Psych negative neurological ROS  negative psych ROS   GI/Hepatic negative GI ROS, Neg liver ROS,   Endo/Other  Hypothyroidism   Renal/GU negative Renal ROS  negative genitourinary   Musculoskeletal negative musculoskeletal ROS (+)   Abdominal   Peds negative pediatric ROS (+)  Hematology negative hematology ROS (+)   Anesthesia Other Findings   Reproductive/Obstetrics negative OB ROS                            Anesthesia Physical Anesthesia Plan  ASA: II  Anesthesia Plan: Spinal   Post-op Pain Management:    Induction:   PONV Risk Score and Plan: 2 and Ondansetron and Treatment may vary due to age or medical condition  Airway Management Planned: Simple Face Mask  Additional Equipment:   Intra-op Plan:   Post-operative Plan:   Informed Consent: I have reviewed the patients History and Physical, chart, labs and discussed the procedure including the risks, benefits and alternatives for the proposed anesthesia with the patient or authorized representative who has indicated his/her understanding and acceptance.   Dental advisory given  Plan Discussed with: CRNA  Anesthesia Plan Comments:         Anesthesia Quick Evaluation

## 2017-08-04 ENCOUNTER — Other Ambulatory Visit (HOSPITAL_COMMUNITY): Payer: Managed Care, Other (non HMO)

## 2017-08-04 ENCOUNTER — Inpatient Hospital Stay (HOSPITAL_COMMUNITY)
Admission: RE | Admit: 2017-08-04 | Discharge: 2017-08-05 | DRG: 470 | Disposition: A | Discharge status: Home Health | Payer: Managed Care, Other (non HMO) | Source: Ambulatory Visit | Attending: Orthopedic Surgery | Admitting: Orthopedic Surgery

## 2017-08-04 ENCOUNTER — Encounter (HOSPITAL_COMMUNITY)
Admission: RE | Disposition: A | Discharge status: Home Health | Payer: Self-pay | Source: Ambulatory Visit | Attending: Orthopedic Surgery

## 2017-08-04 ENCOUNTER — Inpatient Hospital Stay (HOSPITAL_COMMUNITY): Payer: Managed Care, Other (non HMO) | Admitting: Anesthesiology

## 2017-08-04 ENCOUNTER — Other Ambulatory Visit: Payer: Self-pay

## 2017-08-04 ENCOUNTER — Inpatient Hospital Stay (HOSPITAL_COMMUNITY): Payer: Managed Care, Other (non HMO)

## 2017-08-04 ENCOUNTER — Encounter (HOSPITAL_COMMUNITY): Payer: Self-pay | Admitting: *Deleted

## 2017-08-04 DIAGNOSIS — F419 Anxiety disorder, unspecified: Secondary | ICD-10-CM | POA: Diagnosis present

## 2017-08-04 DIAGNOSIS — Z79890 Hormone replacement therapy (postmenopausal): Secondary | ICD-10-CM

## 2017-08-04 DIAGNOSIS — E039 Hypothyroidism, unspecified: Secondary | ICD-10-CM | POA: Diagnosis present

## 2017-08-04 DIAGNOSIS — Z79810 Long term (current) use of selective estrogen receptor modulators (SERMs): Secondary | ICD-10-CM | POA: Diagnosis not present

## 2017-08-04 DIAGNOSIS — M1611 Unilateral primary osteoarthritis, right hip: Secondary | ICD-10-CM | POA: Diagnosis present

## 2017-08-04 DIAGNOSIS — Z923 Personal history of irradiation: Secondary | ICD-10-CM | POA: Diagnosis not present

## 2017-08-04 DIAGNOSIS — C50412 Malignant neoplasm of upper-outer quadrant of left female breast: Secondary | ICD-10-CM | POA: Diagnosis present

## 2017-08-04 DIAGNOSIS — M25551 Pain in right hip: Secondary | ICD-10-CM | POA: Diagnosis present

## 2017-08-04 DIAGNOSIS — F329 Major depressive disorder, single episode, unspecified: Secondary | ICD-10-CM | POA: Diagnosis present

## 2017-08-04 DIAGNOSIS — I10 Essential (primary) hypertension: Secondary | ICD-10-CM | POA: Diagnosis present

## 2017-08-04 DIAGNOSIS — Z419 Encounter for procedure for purposes other than remedying health state, unspecified: Secondary | ICD-10-CM

## 2017-08-04 HISTORY — PX: TOTAL HIP ARTHROPLASTY: SHX124

## 2017-08-04 LAB — POCT I-STAT 4, (NA,K, GLUC, HGB,HCT)
GLUCOSE: 92 mg/dL (ref 65–99)
HCT: 35 % — ABNORMAL LOW (ref 36.0–46.0)
Hemoglobin: 11.9 g/dL — ABNORMAL LOW (ref 12.0–15.0)
POTASSIUM: 4.4 mmol/L (ref 3.5–5.1)
Sodium: 139 mmol/L (ref 135–145)

## 2017-08-04 LAB — TYPE AND SCREEN
ABO/RH(D): A POS
Antibody Screen: NEGATIVE

## 2017-08-04 SURGERY — ARTHROPLASTY, HIP, TOTAL, ANTERIOR APPROACH
Anesthesia: Spinal | Site: Hip | Laterality: Right

## 2017-08-04 MED ORDER — METHOCARBAMOL 1000 MG/10ML IJ SOLN
500.0000 mg | Freq: Four times a day (QID) | INTRAMUSCULAR | Status: DC | PRN
  Administered 2017-08-04: 500 mg via INTRAVENOUS
  Filled 2017-08-04: qty 550

## 2017-08-04 MED ORDER — LIDOCAINE 2% (20 MG/ML) 5 ML SYRINGE
INTRAMUSCULAR | Status: AC
  Filled 2017-08-04: qty 5

## 2017-08-04 MED ORDER — HYDROCODONE-ACETAMINOPHEN 5-325 MG PO TABS
1.0000 | ORAL_TABLET | ORAL | Status: DC | PRN
  Administered 2017-08-04 – 2017-08-05 (×5): 2 via ORAL
  Filled 2017-08-04 (×5): qty 2

## 2017-08-04 MED ORDER — EPHEDRINE 5 MG/ML INJ
INTRAVENOUS | Status: AC
  Filled 2017-08-04: qty 10

## 2017-08-04 MED ORDER — SODIUM CHLORIDE 0.9 % IV SOLN: INTRAVENOUS | Status: DC

## 2017-08-04 MED ORDER — SODIUM CHLORIDE 0.9 % IJ SOLN
INTRAMUSCULAR | Status: AC
  Filled 2017-08-04: qty 50

## 2017-08-04 MED ORDER — BUPIVACAINE HCL (PF) 0.25 % IJ SOLN
INTRAMUSCULAR | Status: DC | PRN
  Administered 2017-08-04: 30 mL

## 2017-08-04 MED ORDER — MEPERIDINE HCL 50 MG/ML IJ SOLN: 6.2500 mg | INTRAMUSCULAR | Status: DC | PRN

## 2017-08-04 MED ORDER — LACTATED RINGERS IV SOLN
INTRAVENOUS | Status: DC | PRN
  Administered 2017-08-04 (×2): via INTRAVENOUS

## 2017-08-04 MED ORDER — THYROID 30 MG PO TABS
30.0000 mg | ORAL_TABLET | Freq: Two times a day (BID) | ORAL | Status: DC
  Administered 2017-08-04 – 2017-08-05 (×2): 30 mg via ORAL
  Filled 2017-08-04 (×2): qty 1

## 2017-08-04 MED ORDER — METOCLOPRAMIDE HCL 5 MG/ML IJ SOLN: 10.0000 mg | Freq: Once | INTRAMUSCULAR | Status: DC | PRN

## 2017-08-04 MED ORDER — FENTANYL CITRATE (PF) 100 MCG/2ML IJ SOLN: 25.0000 ug | INTRAMUSCULAR | Status: DC | PRN

## 2017-08-04 MED ORDER — DEXAMETHASONE SODIUM PHOSPHATE 10 MG/ML IJ SOLN
10.0000 mg | Freq: Two times a day (BID) | INTRAMUSCULAR | Status: DC
  Administered 2017-08-04 (×2): 10 mg via INTRAVENOUS
  Filled 2017-08-04 (×3): qty 1

## 2017-08-04 MED ORDER — TAMOXIFEN CITRATE 10 MG PO TABS
10.0000 mg | ORAL_TABLET | Freq: Two times a day (BID) | ORAL | Status: DC
  Administered 2017-08-05 (×2): 10 mg via ORAL
  Filled 2017-08-04 (×3): qty 1

## 2017-08-04 MED ORDER — FENTANYL CITRATE (PF) 100 MCG/2ML IJ SOLN
INTRAMUSCULAR | Status: AC
  Filled 2017-08-04: qty 2

## 2017-08-04 MED ORDER — POLYETHYLENE GLYCOL 3350 17 G PO PACK: 17.0000 g | PACK | Freq: Every day | ORAL | Status: DC | PRN

## 2017-08-04 MED ORDER — MIDAZOLAM HCL 5 MG/5ML IJ SOLN
INTRAMUSCULAR | Status: DC | PRN
  Administered 2017-08-04 (×2): 2 mg via INTRAVENOUS

## 2017-08-04 MED ORDER — 0.9 % SODIUM CHLORIDE (POUR BTL) OPTIME
TOPICAL | Status: DC | PRN
  Administered 2017-08-04: 1000 mL

## 2017-08-04 MED ORDER — BISACODYL 5 MG PO TBEC: 5.0000 mg | DELAYED_RELEASE_TABLET | Freq: Every day | ORAL | Status: DC | PRN

## 2017-08-04 MED ORDER — TIZANIDINE HCL 2 MG PO TABS: 2.0000 mg | ORAL_TABLET | Freq: Three times a day (TID) | ORAL | 0 refills | Status: DC | PRN

## 2017-08-04 MED ORDER — ESCITALOPRAM OXALATE 20 MG PO TABS
20.0000 mg | ORAL_TABLET | Freq: Every day | ORAL | Status: DC
  Administered 2017-08-05: 11:00:00 20 mg via ORAL
  Filled 2017-08-04: qty 1

## 2017-08-04 MED ORDER — DIPHENHYDRAMINE HCL 12.5 MG/5ML PO ELIX: 12.5000 mg | ORAL_SOLUTION | ORAL | Status: DC | PRN

## 2017-08-04 MED ORDER — DOCUSATE SODIUM 100 MG PO CAPS
100.0000 mg | ORAL_CAPSULE | Freq: Two times a day (BID) | ORAL | Status: DC
  Administered 2017-08-04 – 2017-08-05 (×2): 100 mg via ORAL
  Filled 2017-08-04 (×2): qty 1

## 2017-08-04 MED ORDER — LACTATED RINGERS IV SOLN
INTRAVENOUS | Status: DC
  Administered 2017-08-04: 10:00:00 via INTRAVENOUS

## 2017-08-04 MED ORDER — BUPIVACAINE HCL (PF) 0.25 % IJ SOLN
INTRAMUSCULAR | Status: AC
  Filled 2017-08-04: qty 30

## 2017-08-04 MED ORDER — FENTANYL CITRATE (PF) 100 MCG/2ML IJ SOLN
INTRAMUSCULAR | Status: DC | PRN
  Administered 2017-08-04: 100 ug via INTRAVENOUS

## 2017-08-04 MED ORDER — ALPRAZOLAM 0.5 MG PO TABS
0.5000 mg | ORAL_TABLET | Freq: Every evening | ORAL | Status: DC | PRN
Start: 2017-08-04 — End: 2017-08-05

## 2017-08-04 MED ORDER — DOCUSATE SODIUM 100 MG PO CAPS: 100.0000 mg | ORAL_CAPSULE | Freq: Two times a day (BID) | ORAL | 0 refills | Status: DC

## 2017-08-04 MED ORDER — DEXAMETHASONE SODIUM PHOSPHATE 10 MG/ML IJ SOLN
INTRAMUSCULAR | Status: AC
  Filled 2017-08-04: qty 1

## 2017-08-04 MED ORDER — TRANEXAMIC ACID 1000 MG/10ML IV SOLN
1000.0000 mg | INTRAVENOUS | Status: AC
  Administered 2017-08-04: 1000 mg via INTRAVENOUS
  Filled 2017-08-04: qty 1100

## 2017-08-04 MED ORDER — EPHEDRINE SULFATE-NACL 50-0.9 MG/10ML-% IV SOSY
PREFILLED_SYRINGE | INTRAVENOUS | Status: DC | PRN
  Administered 2017-08-04 (×5): 10 mg via INTRAVENOUS

## 2017-08-04 MED ORDER — CELECOXIB 200 MG PO CAPS
200.0000 mg | ORAL_CAPSULE | Freq: Two times a day (BID) | ORAL | Status: DC
  Administered 2017-08-04 – 2017-08-05 (×3): 200 mg via ORAL
  Filled 2017-08-04 (×3): qty 1

## 2017-08-04 MED ORDER — TRANEXAMIC ACID 1000 MG/10ML IV SOLN
1000.0000 mg | Freq: Once | INTRAVENOUS | Status: AC
  Administered 2017-08-04: 1000 mg via INTRAVENOUS
  Filled 2017-08-04: qty 1100

## 2017-08-04 MED ORDER — ASPIRIN EC 325 MG PO TBEC
325.0000 mg | DELAYED_RELEASE_TABLET | Freq: Two times a day (BID) | ORAL | Status: DC
  Administered 2017-08-04 – 2017-08-05 (×2): 325 mg via ORAL
  Filled 2017-08-04 (×2): qty 1

## 2017-08-04 MED ORDER — CHLORHEXIDINE GLUCONATE 4 % EX LIQD: 60.0000 mL | Freq: Once | CUTANEOUS | Status: DC

## 2017-08-04 MED ORDER — MAGNESIUM CITRATE PO SOLN: 1.0000 | Freq: Once | ORAL | Status: DC | PRN

## 2017-08-04 MED ORDER — PHENYLEPHRINE 40 MCG/ML (10ML) SYRINGE FOR IV PUSH (FOR BLOOD PRESSURE SUPPORT)
PREFILLED_SYRINGE | INTRAVENOUS | Status: AC
  Filled 2017-08-04: qty 10

## 2017-08-04 MED ORDER — MORPHINE SULFATE (PF) 2 MG/ML IV SOLN
0.5000 mg | INTRAVENOUS | Status: DC | PRN
  Administered 2017-08-04 (×2): 1 mg via INTRAVENOUS
  Filled 2017-08-04 (×2): qty 1

## 2017-08-04 MED ORDER — ONDANSETRON HCL 4 MG PO TABS
4.0000 mg | ORAL_TABLET | Freq: Four times a day (QID) | ORAL | Status: DC | PRN
Start: 2017-08-04 — End: 2017-08-05

## 2017-08-04 MED ORDER — PROPOFOL 10 MG/ML IV BOLUS
INTRAVENOUS | Status: AC
  Filled 2017-08-04: qty 20

## 2017-08-04 MED ORDER — HYDROCODONE-ACETAMINOPHEN 5-325 MG PO TABS: 1.0000 | ORAL_TABLET | Freq: Four times a day (QID) | ORAL | 0 refills | Status: DC | PRN

## 2017-08-04 MED ORDER — ASPIRIN EC 325 MG PO TBEC: 325.0000 mg | DELAYED_RELEASE_TABLET | Freq: Two times a day (BID) | ORAL | 0 refills | Status: DC

## 2017-08-04 MED ORDER — FENTANYL CITRATE (PF) 250 MCG/5ML IJ SOLN
INTRAMUSCULAR | Status: AC
  Filled 2017-08-04: qty 5

## 2017-08-04 MED ORDER — PHENYLEPHRINE 40 MCG/ML (10ML) SYRINGE FOR IV PUSH (FOR BLOOD PRESSURE SUPPORT)
PREFILLED_SYRINGE | INTRAVENOUS | Status: DC | PRN
  Administered 2017-08-04 (×2): 80 ug via INTRAVENOUS
  Administered 2017-08-04: 40 ug via INTRAVENOUS

## 2017-08-04 MED ORDER — PROPOFOL 10 MG/ML IV BOLUS
INTRAVENOUS | Status: DC | PRN
  Administered 2017-08-04 (×2): 20 mg via INTRAVENOUS

## 2017-08-04 MED ORDER — CEFAZOLIN SODIUM-DEXTROSE 2-4 GM/100ML-% IV SOLN
2.0000 g | Freq: Four times a day (QID) | INTRAVENOUS | Status: AC
  Administered 2017-08-04 (×2): 2 g via INTRAVENOUS
  Filled 2017-08-04 (×2): qty 100

## 2017-08-04 MED ORDER — GABAPENTIN 300 MG PO CAPS
300.0000 mg | ORAL_CAPSULE | Freq: Two times a day (BID) | ORAL | Status: DC
  Administered 2017-08-04 – 2017-08-05 (×3): 300 mg via ORAL
  Filled 2017-08-04 (×3): qty 1

## 2017-08-04 MED ORDER — MIDAZOLAM HCL 2 MG/2ML IJ SOLN
INTRAMUSCULAR | Status: AC
  Filled 2017-08-04: qty 2

## 2017-08-04 MED ORDER — LIDOCAINE 2% (20 MG/ML) 5 ML SYRINGE
INTRAMUSCULAR | Status: DC | PRN
  Administered 2017-08-04: 100 mg via INTRAVENOUS

## 2017-08-04 MED ORDER — PROPOFOL 500 MG/50ML IV EMUL
INTRAVENOUS | Status: DC | PRN
  Administered 2017-08-04: 125 ug/kg/min via INTRAVENOUS

## 2017-08-04 MED ORDER — METHOCARBAMOL 500 MG PO TABS
500.0000 mg | ORAL_TABLET | Freq: Four times a day (QID) | ORAL | Status: DC | PRN
  Administered 2017-08-04: 18:00:00 500 mg via ORAL
  Filled 2017-08-04: qty 1

## 2017-08-04 MED ORDER — BUPIVACAINE LIPOSOME 1.3 % IJ SUSP
20.0000 mL | Freq: Once | INTRAMUSCULAR | Status: AC
Start: 2017-08-04 — End: 2017-08-04
  Administered 2017-08-04: 20 mL
  Filled 2017-08-04: qty 20

## 2017-08-04 MED ORDER — CEFAZOLIN SODIUM-DEXTROSE 2-4 GM/100ML-% IV SOLN
2.0000 g | INTRAVENOUS | Status: AC
  Administered 2017-08-04: 2 g via INTRAVENOUS
  Filled 2017-08-04: qty 100

## 2017-08-04 MED ORDER — LACTATED RINGERS IV SOLN: INTRAVENOUS | Status: DC

## 2017-08-04 MED ORDER — NEBIVOLOL HCL 10 MG PO TABS
10.0000 mg | ORAL_TABLET | Freq: Every day | ORAL | Status: DC
  Administered 2017-08-05: 11:00:00 10 mg via ORAL
  Filled 2017-08-04: qty 1

## 2017-08-04 MED ORDER — ONDANSETRON HCL 4 MG/2ML IJ SOLN
INTRAMUSCULAR | Status: AC
  Filled 2017-08-04: qty 2

## 2017-08-04 MED ORDER — BUPIVACAINE IN DEXTROSE 0.75-8.25 % IT SOLN
INTRATHECAL | Status: DC | PRN
  Administered 2017-08-04: 1.7 mL via INTRATHECAL

## 2017-08-04 MED ORDER — STERILE WATER FOR IRRIGATION IR SOLN
Status: DC | PRN
  Administered 2017-08-04: 2000 mL

## 2017-08-04 MED ORDER — ONDANSETRON HCL 4 MG/2ML IJ SOLN: 4.0000 mg | Freq: Four times a day (QID) | INTRAMUSCULAR | Status: DC | PRN

## 2017-08-04 MED ORDER — PROPOFOL 10 MG/ML IV BOLUS
INTRAVENOUS | Status: AC
  Filled 2017-08-04: qty 40

## 2017-08-04 SURGICAL SUPPLY — 43 items
BAG ZIPLOCK 12X15 (MISCELLANEOUS) IMPLANT
BENZOIN TINCTURE PRP APPL 2/3 (GAUZE/BANDAGES/DRESSINGS) ×3 IMPLANT
BLADE SAW SGTL 18X1.27X75 (BLADE) ×2 IMPLANT
BLADE SAW SGTL 18X1.27X75MM (BLADE) ×1
BNDG COHESIVE 6X5 TAN STRL LF (GAUZE/BANDAGES/DRESSINGS) IMPLANT
CAPT HIP TOTAL 2 ×3 IMPLANT
CELLS DAT CNTRL 66122 CELL SVR (MISCELLANEOUS) ×1 IMPLANT
CLOSURE WOUND 1/2 X4 (GAUZE/BANDAGES/DRESSINGS) ×1
COVER PERINEAL POST (MISCELLANEOUS) ×3 IMPLANT
COVER SURGICAL LIGHT HANDLE (MISCELLANEOUS) ×3 IMPLANT
DRAPE STERI IOBAN 125X83 (DRAPES) ×3 IMPLANT
DRAPE U-SHAPE 47X51 STRL (DRAPES) ×6 IMPLANT
DRESSING AQUACEL AG SP 3.5X6 (GAUZE/BANDAGES/DRESSINGS) ×1 IMPLANT
DRSG AQUACEL AG ADV 3.5X10 (GAUZE/BANDAGES/DRESSINGS) ×3 IMPLANT
DRSG AQUACEL AG SP 3.5X6 (GAUZE/BANDAGES/DRESSINGS) ×3
DURAPREP 26ML APPLICATOR (WOUND CARE) ×3 IMPLANT
ELECT REM PT RETURN 15FT ADLT (MISCELLANEOUS) ×3 IMPLANT
GAUZE XEROFORM 1X8 LF (GAUZE/BANDAGES/DRESSINGS) IMPLANT
GLOVE BIO SURGEON STRL SZ 6.5 (GLOVE) ×2 IMPLANT
GLOVE BIO SURGEONS STRL SZ 6.5 (GLOVE) ×1
GLOVE BIOGEL PI IND STRL 6.5 (GLOVE) ×1 IMPLANT
GLOVE BIOGEL PI IND STRL 7.5 (GLOVE) ×3 IMPLANT
GLOVE BIOGEL PI IND STRL 8 (GLOVE) ×2 IMPLANT
GLOVE BIOGEL PI INDICATOR 6.5 (GLOVE) ×2
GLOVE BIOGEL PI INDICATOR 7.5 (GLOVE) ×6
GLOVE BIOGEL PI INDICATOR 8 (GLOVE) ×4
GLOVE ECLIPSE 7.5 STRL STRAW (GLOVE) ×12 IMPLANT
GOWN STRL REUS W/ TWL XL LVL3 (GOWN DISPOSABLE) ×1 IMPLANT
GOWN STRL REUS W/TWL LRG LVL3 (GOWN DISPOSABLE) ×3 IMPLANT
GOWN STRL REUS W/TWL XL LVL3 (GOWN DISPOSABLE) ×8 IMPLANT
HOLDER FOLEY CATH W/STRAP (MISCELLANEOUS) ×3 IMPLANT
HOOD PEEL AWAY FLYTE STAYCOOL (MISCELLANEOUS) ×6 IMPLANT
PACK ANTERIOR HIP CUSTOM (KITS) ×3 IMPLANT
RTRCTR WOUND ALEXIS 18CM MED (MISCELLANEOUS) ×3
STRIP CLOSURE SKIN 1/2X4 (GAUZE/BANDAGES/DRESSINGS) ×2 IMPLANT
SUT ETHIBOND NAB CT1 #1 30IN (SUTURE) ×6 IMPLANT
SUT MNCRL AB 3-0 PS2 18 (SUTURE) ×3 IMPLANT
SUT VIC AB 0 CT1 36 (SUTURE) ×3 IMPLANT
SUT VIC AB 1 CT1 36 (SUTURE) ×6 IMPLANT
SUT VIC AB 2-0 CT1 27 (SUTURE) ×4
SUT VIC AB 2-0 CT1 TAPERPNT 27 (SUTURE) ×2 IMPLANT
TRAY FOLEY CATH SILVER 14FR (SET/KITS/TRAYS/PACK) ×3 IMPLANT
YANKAUER SUCT BULB TIP NO VENT (SUCTIONS) ×3 IMPLANT

## 2017-08-04 NOTE — Anesthesia Procedure Notes (Signed)
Spinal  Patient location during procedure: OR Staffing Anesthesiologist: Montez Hageman, MD Performed: anesthesiologist  Preanesthetic Checklist Completed: patient identified, site marked, surgical consent, pre-op evaluation, timeout performed, IV checked, risks and benefits discussed and monitors and equipment checked Spinal Block Patient position: sitting Prep: Betadine Patient monitoring: heart rate, continuous pulse ox and blood pressure Approach: midline Location: L3-4 Injection technique: single-shot Needle Needle type: Spinocan  Needle gauge: 22 G Needle length: 9 cm Additional Notes Expiration date of kit checked and confirmed. Patient tolerated procedure well, without complications.

## 2017-08-04 NOTE — Transfer of Care (Signed)
Immediate Anesthesia Transfer of Care Note  Patient: Erin Barnes  Procedure(s) Performed: RIGHT TOTAL HIP ARTHROPLASTY ANTERIOR APPROACH (Right Hip)  Patient Location: PACU  Anesthesia Type:Spinal  Level of Consciousness: awake, alert  and oriented  Airway & Oxygen Therapy: Patient Spontanous Breathing and Patient connected to face mask oxygen  Post-op Assessment: Report given to RN and Post -op Vital signs reviewed and stable  Post vital signs: Reviewed and stable  Last Vitals:  Vitals:   08/04/17 0535  BP: 126/90  Pulse: 72  Resp: 16  Temp: 36.8 C  SpO2: 94%    Last Pain:  Vitals:   08/04/17 0609  TempSrc:   PainSc: 5          Complications: No apparent anesthesia complications

## 2017-08-04 NOTE — Brief Op Note (Signed)
08/04/2017  9:09 AM  PATIENT:  Erin Barnes  62 y.o. female  PRE-OPERATIVE DIAGNOSIS:  OSTEOARTHRITIS RIGHT HIP  POST-OPERATIVE DIAGNOSIS:  OSTEOARTHRITIS RIGHT HIP  PROCEDURE:  Procedure(s): RIGHT TOTAL HIP ARTHROPLASTY ANTERIOR APPROACH (Right)  SURGEON:  Surgeon(s) and Role:    Dorna Leitz, MD - Primary  PHYSICIAN ASSISTANT:   ASSISTANTS: bethune   ANESTHESIA:   spinal  EBL:  500 mL   BLOOD ADMINISTERED:none  DRAINS: none   LOCAL MEDICATIONS USED:  MARCAINE    and OTHER experel  SPECIMEN:  No Specimen  DISPOSITION OF SPECIMEN:  N/A  COUNTS:  YES  TOURNIQUET:  * No tourniquets in log *  DICTATION: .Other Dictation: Dictation Number [id number]  PLAN OF CARE: Admit to inpatient   PATIENT DISPOSITION:  PACU - hemodynamically stable.   Delay start of Pharmacological VTE agent (>24hrs) due to surgical blood loss or risk of bleeding: no

## 2017-08-04 NOTE — Evaluation (Addendum)
Physical Therapy Evaluation Patient Details Name: Erin Barnes MRN: [id number] DOB: Oct 16, 1955 Today's Date: 08/04/2017   History of Present Illness  R DA-THA  Clinical Impression  Pt is s/p THA resulting in the deficits listed below (see PT Problem List). Pt ambulated 54' with RW, performed THA HEP with min assist. Excellent progress expected.  Pt will benefit from skilled PT to increase their independence and safety with mobility to allow discharge to the venue listed below.      Follow Up Recommendations Follow surgeon's recommendation for DC plan and follow-up therapies    Equipment Recommendations  Rolling walker with 5" wheels;3in1 (PT)    Recommendations for Other Services       Precautions / Restrictions Precautions Precautions: Fall Restrictions Weight Bearing Restrictions: No; WBAT     Mobility  Bed Mobility Overal bed mobility: Modified Independent             General bed mobility comments: HOB up  Transfers Overall transfer level: Needs assistance Equipment used: Rolling walker (2 wheeled) Transfers: Sit to/from Stand Sit to Stand: Min guard         General transfer comment: VCs hand placement  Ambulation/Gait Ambulation/Gait assistance: Min guard Ambulation Distance (Feet): 60 Feet Assistive device: Rolling walker (2 wheeled) Gait Pattern/deviations: Step-to pattern Gait velocity: decr   General Gait Details: VCs hand placement, no LOB  Stairs            Wheelchair Mobility    Modified Rankin (Stroke Patients Only)       Balance Overall balance assessment: Modified Independent                                           Pertinent Vitals/Pain Pain Assessment: 0-10 Pain Score: 5  Pain Location: R hip Pain Descriptors / Indicators: Sore Pain Intervention(s): Limited activity within patient's tolerance;Monitored during session;Premedicated before session;Ice applied    Home Living Family/patient expects to be  discharged to:: Private residence Living Arrangements: Spouse/significant other Available Help at Discharge: Family;Available 24 hours/day Type of Home: House Home Access: Level entry     Home Layout: One level Home Equipment: Cane - single point;Cane - quad      Prior Function Level of Independence: Independent               Hand Dominance        Extremity/Trunk Assessment   Upper Extremity Assessment Upper Extremity Assessment: Overall WFL for tasks assessed    Lower Extremity Assessment Lower Extremity Assessment: RLE deficits/detail RLE Deficits / Details: knee ext 3/5, hip strength -3/5, hip AAROM WFL    Cervical / Trunk Assessment Cervical / Trunk Assessment: Normal  Communication   Communication: No difficulties  Cognition Arousal/Alertness: Awake/alert Behavior During Therapy: WFL for tasks assessed/performed Overall Cognitive Status: Within Functional Limits for tasks assessed                                        General Comments      Exercises Total Joint Exercises Ankle Circles/Pumps: AROM;Both;Supine;10 reps Quad Sets: AROM;Both;5 reps;Supine Heel Slides: AAROM;Right;10 reps;Supine Hip ABduction/ADduction: AAROM;Right;10 reps;Supine Long Arc Quad: AROM;10 reps;Right;Seated   Assessment/Plan    PT Assessment Patient needs continued PT services  PT Problem List Decreased strength;Decreased mobility;Decreased activity tolerance;Decreased knowledge of use of DME  PT Treatment Interventions DME instruction;Gait training;Therapeutic activities;Functional mobility training;Therapeutic exercise;Patient/family education    PT Goals (Current goals can be found in the Care Plan section)  Acute Rehab PT Goals Patient Stated Goal: work out in the gym -ride bike, do eliptical PT Goal Formulation: With patient Time For Goal Achievement: 08/11/17 Potential to Achieve Goals: Good    Frequency 7X/week   Barriers to discharge         Co-evaluation               AM-PAC PT "6 Clicks" Daily Activity  Outcome Measure Difficulty turning over in bed (including adjusting bedclothes, sheets and blankets)?: A Little Difficulty moving from lying on back to sitting on the side of the bed? : A Little Difficulty sitting down on and standing up from a chair with arms (e.g., wheelchair, bedside commode, etc,.)?: A Little Help needed moving to and from a bed to chair (including a wheelchair)?: A Little Help needed walking in hospital room?: A Little Help needed climbing 3-5 steps with a railing? : A Lot 6 Click Score: 17    End of Session Equipment Utilized During Treatment: Gait belt Activity Tolerance: Patient tolerated treatment well Patient left: in chair;with call bell/phone within reach Nurse Communication: Mobility status PT Visit Diagnosis: Difficulty in walking, not elsewhere classified (R26.2);Muscle weakness (generalized) (M62.81);Pain Pain - Right/Left: Right Pain - part of body: Hip    Time: 1425-1444 PT Time Calculation (min) (ACUTE ONLY): 19 min   Charges:   PT Evaluation $PT Eval Low Complexity: 1 Low     PT G Codes:          Philomena Doheny 08/04/2017, 2:54 PM [phone]

## 2017-08-04 NOTE — Anesthesia Postprocedure Evaluation (Signed)
Anesthesia Post Note  Patient: Erin Barnes  Procedure(s) Performed: RIGHT TOTAL HIP ARTHROPLASTY ANTERIOR APPROACH (Right Hip)     Patient location during evaluation: PACU Anesthesia Type: Spinal Level of consciousness: awake and alert Pain management: pain level controlled Vital Signs Assessment: post-procedure vital signs reviewed and stable Respiratory status: spontaneous breathing and respiratory function stable Cardiovascular status: blood pressure returned to baseline and stable Postop Assessment: spinal receding Anesthetic complications: no    Last Vitals:  Vitals:   08/04/17 1328 08/04/17 1420  BP: 90/71 130/69  Pulse: 82 75  Resp: 16 15  Temp: 36.7 C 36.6 C  SpO2: 99% 99%    Last Pain:  Vitals:   08/04/17 1420  TempSrc: Oral  PainSc:                  Nolon Nations

## 2017-08-04 NOTE — Discharge Instructions (Signed)

## 2017-08-04 NOTE — H&P (Signed)
TOTAL HIP ADMISSION H&P  Patient is admitted for right total hip arthroplasty.  Subjective:  Chief Complaint: right hip pain  HPI: Erin Barnes, 62 y.o. female, has a history of pain and functional disability in the right hip(s) due to arthritis and patient has failed non-surgical conservative treatments for greater than 12 weeks to include NSAID's and/or analgesics, corticosteriod injections, weight reduction as appropriate and activity modification.  Onset of symptoms was gradual starting 3 years ago with gradually worsening course since that time.The patient noted no past surgery on the right hip(s).  Patient currently rates pain in the right hip at 9 out of 10 with activity. Patient has night pain, worsening of pain with activity and weight bearing, trendelenberg gait, pain that interfers with activities of daily living, pain with passive range of motion, crepitus and joint swelling. Patient has evidence of subchondral sclerosis, periarticular osteophytes and joint space narrowing by imaging studies. This condition presents safety issues increasing the risk of falls. This patient has had Failure of all reasonable conservative care.  There is no current active infection.  Patient Active Problem List   Diagnosis Date Noted  . Breast cancer of upper-outer quadrant of left female breast ([place]) 01/27/2014   Past Medical History:  Diagnosis Date  . Anxiety   . Arthritis   . Breast cancer ([place]) 01/23/14   left insitu  . Depression   . Hypertension   . Hypothyroidism   . Postmenopausal HRT (hormone replacement therapy) 2011- 2015   Bio- identical HRT @ [place] [facility]  . S/P radiation therapy 04/01/2014 through 05/12/2014                                                      Left breast 4800 cGy in 24 sessions, left breast boost 1000 cGy in 5 sessions                          . Wears contact lenses     Past Surgical History:  Procedure Laterality Date  . BREAST LUMPECTOMY WITH RADIOACTIVE SEED  LOCALIZATION Left 02/12/2014   Procedure: LEFT BREAST RADIOACTIVE SEED LOCALIZATION LUMPECTOMY ;  Surgeon: Autumn Messing III, MD;  Location: [facility];  Service: General;  Laterality: Left;  . COLONOSCOPY    . JOINT REPLACEMENT     Right total hip Dr. Berenice Primas 08-04-17  . URETHRAL DILATION    . WISDOM TOOTH EXTRACTION      Current Facility-Administered Medications  Medication Dose Route Frequency Provider Last Rate Last Dose  . bupivacaine liposome (EXPAREL) 1.3 % injection 266 mg  20 mL Infiltration Once Dorna Leitz, MD      . ceFAZolin (ANCEF) IVPB 2g/100 mL premix  2 g Intravenous On Call to OR Dorna Leitz, MD      . chlorhexidine (HIBICLENS) 4 % liquid 4 application  60 mL Topical Once Dorna Leitz, MD      . tranexamic acid (CYKLOKAPRON) 1,000 mg in sodium chloride 0.9 % 100 mL IVPB  1,000 mg Intravenous To OR Williford, Peggy D, CRNA       Facility-Administered Medications Ordered in Other Encounters  Medication Dose Route Frequency Provider Last Rate Last Dose  . lactated ringers infusion    Continuous PRN Williford, Peggy D, CRNA       No Known  Allergies  Social History   Tobacco Use  . Smoking status: Never Smoker  . Smokeless tobacco: Never Used  Substance Use Topics  . Alcohol use: Yes    Alcohol/week: 12.0 oz    Types: 20 Cans of beer per week    Family History  Problem Relation Age of Onset  . Prostate cancer Father 71  . Arthritis Mother   . Osteoporosis Mother        hip fracture     ROS ROS: I have reviewed the patient's review of systems thoroughly and there are no positive responses as relates to the HPI. Objective:  Physical Exam  Vital signs in last 24 hours: Temp:  [98.3 F (36.8 C)] 98.3 F (36.8 C) (03/08 0535) Pulse Rate:  [72] 72 (03/08 0535) Resp:  [16] 16 (03/08 0535) BP: (126)/(90) 126/90 (03/08 0535) SpO2:  [94 %] 94 % (03/08 0535) Weight:  [82.1 kg (181 lb)] 82.1 kg (181 lb) (03/08 0609) Well-developed well-nourished patient  in no acute distress. Alert and oriented x3 HEENT:within normal limits Cardiac: Regular rate and rhythm Pulmonary: Lungs clear to auscultation Abdomen: Soft and nontender.  Normal active bowel sounds  Musculoskeletal: (Right hip: Limited range of motion.  Limited internal rotation.  Pain on range of motion.  Neurovascular intact distally. Labs: Recent Results (from the past 2160 hour(s))  Surgical pcr screen     Status: None   Collection Time: 08/01/17  2:18 PM  Result Value Ref Range   MRSA, PCR NEGATIVE NEGATIVE   Staphylococcus aureus NEGATIVE NEGATIVE    Comment: (NOTE) The Xpert SA Assay (FDA approved for NASAL specimens in patients 38 years of age and older), is one component of a comprehensive surveillance program. It is not intended to diagnose infection nor to guide or monitor treatment. Performed at [facility], [place] [address] [place], [place] [address]   APTT     Status: None   Collection Time: 08/01/17  2:55 PM  Result Value Ref Range   aPTT 25 24 - 36 seconds    Comment: Performed at [facility], [place] [address] [place], [place] [address]  CBC WITH DIFFERENTIAL     Status: None   Collection Time: 08/01/17  2:55 PM  Result Value Ref Range   WBC 6.5 4.0 - 10.5 K/uL   RBC 4.04 3.87 - 5.11 MIL/uL   Hemoglobin 13.7 12.0 - 15.0 g/dL   HCT 39.4 36.0 - 46.0 %   MCV 97.5 78.0 - 100.0 fL   MCH 33.9 26.0 - 34.0 pg   MCHC 34.8 30.0 - 36.0 g/dL   RDW 12.1 11.5 - 15.5 %   Platelets 252 150 - 400 K/uL   Neutrophils Relative % 71 %   Neutro Abs 4.6 1.7 - 7.7 K/uL   Lymphocytes Relative 19 %   Lymphs Abs 1.3 0.7 - 4.0 K/uL   Monocytes Relative 7 %   Monocytes Absolute 0.4 0.1 - 1.0 K/uL   Eosinophils Relative 2 %   Eosinophils Absolute 0.1 0.0 - 0.7 K/uL   Basophils Relative 1 %   Basophils Absolute 0.0 0.0 - 0.1 K/uL    Comment: Performed at [facility], [place] [address], [place],  [address]   Comprehensive metabolic panel     Status: Abnormal   Collection Time: 08/01/17  2:55 PM  Result Value Ref Range   Sodium 139 135 - 145 mmol/L   Potassium 5.3 (H) 3.5 - 5.1 mmol/L  Chloride 104 101 - 111 mmol/L   CO2 27 22 - 32 mmol/L   Glucose, Bld 101 (H) 65 - 99 mg/dL   BUN 7 6 - 20 mg/dL   Creatinine, Ser 0.77 0.44 - 1.00 mg/dL   Calcium 9.6 8.9 - 10.3 mg/dL   Total Protein 7.1 6.5 - 8.1 g/dL   Albumin 3.9 3.5 - 5.0 g/dL   AST 51 (H) 15 - 41 U/L   ALT 52 14 - 54 U/L   Alkaline Phosphatase 67 38 - 126 U/L   Total Bilirubin 0.5 0.3 - 1.2 mg/dL   GFR calc non Af Amer >60 >60 mL/min   GFR calc Af Amer >60 >60 mL/min    Comment: (NOTE) The eGFR has been calculated using the CKD EPI equation. This calculation has not been validated in all clinical situations. eGFR's persistently <60 mL/min signify possible Chronic Kidney Disease.    Anion gap 8 5 - 15    Comment: Performed at [facility], [place] [address], [place], [place] [address]  Protime-INR     Status: None   Collection Time: 08/01/17  2:55 PM  Result Value Ref Range   Prothrombin Time 13.2 11.4 - 15.2 seconds   INR 1.01     Comment: Performed at [facility], [place] [address], [place], [place] [address]  Type and screen Order type and screen if day of surgery is less than 15 days from draw of preadmission visit or order morning of surgery if day of surgery is greater than 6 days from preadmission visit.     Status: None   Collection Time: 08/01/17  2:55 PM  Result Value Ref Range   ABO/RH(D) A POS    Antibody Screen NEG    Sample Expiration 08/07/2017    Extend sample reason      NO TRANSFUSIONS OR PREGNANCY IN THE PAST 3 MONTHS Performed at [facility], [place] [address], [place],  [address]   ABO/Rh     Status: None   Collection Time: 08/01/17  2:55 PM  Result Value Ref Range   ABO/RH(D)      A POS Performed at [facility], [place]  [address] [place], [place] [address]   I-STAT 4, (NA,K, GLUC, HGB,HCT)     Status: Abnormal   Collection Time: 08/04/17  6:22 AM  Result Value Ref Range   Sodium 139 135 - 145 mmol/L   Potassium 4.4 3.5 - 5.1 mmol/L   Glucose, Bld 92 65 - 99 mg/dL   HCT 35.0 (L) 36.0 - 46.0 %   Hemoglobin 11.9 (L) 12.0 - 15.0 g/dL    Estimated body mass index is 31.07 kg/m as calculated from the following:   Height as of this encounter: '5\' 4"'  (1.626 m).   Weight as of this encounter: 82.1 kg (181 lb).   Imaging Review Plain radiographs demonstrate severe degenerative joint disease of the right hip(s). The bone quality appears to be fair for age and reported activity level.  Assessment/Plan:  End stage arthritis, right hip(s)  The patient history, physical examination, clinical judgement of the provider and imaging studies are consistent with end stage degenerative joint disease of the right hip(s) and total hip arthroplasty is deemed medically necessary. The treatment options including medical management, injection therapy, arthroscopy and arthroplasty were discussed at length. The risks and benefits of total hip arthroplasty were presented and reviewed. The risks due to aseptic loosening, infection, stiffness, dislocation/subluxation,  thromboembolic  complications and other imponderables were discussed.  The patient acknowledged the explanation, agreed to proceed with the plan and consent was signed. Patient is being admitted for inpatient treatment for surgery, pain control, PT, OT, prophylactic antibiotics, VTE prophylaxis, progressive ambulation and ADL's and discharge planning.The patient is planning to be discharged home with home health services

## 2017-08-05 LAB — CBC
HCT: 28.7 % — ABNORMAL LOW (ref 36.0–46.0)
Hemoglobin: 9.9 g/dL — ABNORMAL LOW (ref 12.0–15.0)
MCH: 34.1 pg — AB (ref 26.0–34.0)
MCHC: 34.5 g/dL (ref 30.0–36.0)
MCV: 99 fL (ref 78.0–100.0)
PLATELETS: 181 10*3/uL (ref 150–400)
RBC: 2.9 MIL/uL — ABNORMAL LOW (ref 3.87–5.11)
RDW: 12.3 % (ref 11.5–15.5)
WBC: 10.1 10*3/uL (ref 4.0–10.5)

## 2017-08-05 NOTE — Care Management Note (Signed)
Case Management Note  Patient Details  Name: Treniya Lobb MRN: [id number] Date of Birth: [birth year]  Subjective/Objective:    S/p R THA                Action/Plan: NCM spoke to pt and offered choice for [facility]. Pt states her HH was arranged with [facility]. Contacted [facility] to confirm. She is on the list. Will need HH PT order. Attending notified. Pt requesting RW for home. Contacted [facility] for RW for home to be delivered to room prior to dc.   Expected Discharge Date:  08/05/17               Expected Discharge Plan:  [place]  In-House Referral:  NA  Discharge planning Services  CM Consult  Post Acute Care Choice:  Home Health Choice offered to:  Patient  DME Arranged:    DME Agency:  [place]:  PT [facility] Agency:  [place]  Status of Service:  Completed, signed off  If discussed at [place] of Stay Meetings, dates discussed:    Additional Comments:  Erenest Rasher, RN 08/05/2017, 10:36 AM

## 2017-08-05 NOTE — Discharge Summary (Signed)
Patient ID: Erin Barnes MRN: [id number] DOB/AGE: 1955/07/26 62 y.o.  Admit date: 08/04/2017 Discharge date: 08/05/2017  Admission Diagnoses:  Principal Problem:   Primary osteoarthritis of right hip   Discharge Diagnoses:  Same  Past Medical History:  Diagnosis Date  . Anxiety   . Arthritis   . Breast cancer ([place]) 01/23/14   left insitu  . Depression   . Hypertension   . Hypothyroidism   . Postmenopausal HRT (hormone replacement therapy) 2011- 2015   Bio- identical HRT @ [place]  . S/P radiation therapy 04/01/2014 through 05/12/2014                                                      Left breast 4800 cGy in 24 sessions, left breast boost 1000 cGy in 5 sessions                          . Wears contact lenses     Surgeries: Procedure(s): RIGHT TOTAL HIP ARTHROPLASTY ANTERIOR APPROACH on 08/04/2017   Consultants:   Discharged Condition: Improved  Hospital Course: Akeema Broder is an 62 y.o. female who was admitted 08/04/2017 for operative treatment ofPrimary osteoarthritis of right hip. Patient has severe unremitting pain that affects sleep, daily activities, and work/hobbies. After pre-op clearance the patient was taken to the operating room on 08/04/2017 and underwent  Procedure(s): RIGHT TOTAL HIP ARTHROPLASTY ANTERIOR APPROACH.    Patient was given perioperative antibiotics:  Anti-infectives (From admission, onward)   Start     Dose/Rate Route Frequency Ordered Stop   08/04/17 1330  ceFAZolin (ANCEF) IVPB 2g/100 mL premix     2 g 200 mL/hr over 30 Minutes Intravenous Every 6 hours 08/04/17 1108 08/04/17 2016   08/04/17 0559  ceFAZolin (ANCEF) IVPB 2g/100 mL premix     2 g 200 mL/hr over 30 Minutes Intravenous On call to O.R. 08/04/17 0559 08/04/17 0736       Patient was given sequential compression devices, early ambulation, and chemoprophylaxis to prevent DVT.  Patient benefited maximally from hospital stay and there were no complications.    Recent vital signs:  Patient  Vitals for the past 24 hrs:  BP Temp Temp src Pulse Resp SpO2  08/05/17 0550 130/72 98.3 F (36.8 C) Oral 85 18 94 %  08/05/17 0157 118/73 98.2 F (36.8 C) Oral 66 18 97 %  08/04/17 2050 136/86 97.6 F (36.4 C) Oral 87 18 94 %  08/04/17 1832 (!) 155/58 98.6 F (37 C) Oral 80 16 96 %     Recent laboratory studies:  Recent Labs    08/04/17 0622 08/05/17 0527  WBC  --  10.1  HGB 11.9* 9.9*  HCT 35.0* 28.7*  PLT  --  181  NA 139  --   K 4.4  --   GLUCOSE 92  --      Discharge Medications:   Allergies as of 08/05/2017   No Known Allergies     Medication List    TAKE these medications   ALPRAZolam 0.5 MG tablet Commonly known as:  XANAX Take 0.5 mg by mouth at bedtime as needed for anxiety.   aspirin EC 325 MG tablet Take 1 tablet (325 mg total) by mouth 2 (two) times daily after a meal. Take x 1 month post op  to decrease risk of blood clots.   Biotin 5000 MCG Caps Take 1 capsule by mouth daily.   CALCIUM 600 PO Take 1 tablet by mouth daily.   celecoxib 200 MG capsule Commonly known as:  CELEBREX Take 200 mg by mouth 2 (two) times daily.   cholecalciferol 1000 units tablet Commonly known as:  VITAMIN D Take 2,000 Units by mouth daily.   docusate sodium 100 MG capsule Commonly known as:  COLACE Take 1 capsule (100 mg total) by mouth 2 (two) times daily.   escitalopram 20 MG tablet Commonly known as:  LEXAPRO Take 20 mg by mouth daily.   HYDROcodone-acetaminophen 5-325 MG tablet Commonly known as:  NORCO Take 1-2 tablets by mouth every 6 (six) hours as needed for moderate pain.   Milk Thistle 200 MG Caps Take 200 mg by mouth 3 (three) times daily.   nebivolol 10 MG tablet Commonly known as:  BYSTOLIC Take 10 mg by mouth daily.   tamoxifen 20 MG tablet Commonly known as:  NOLVADEX TAKE ONE TABLET BY MOUTH DAILY   thyroid 30 MG tablet Commonly known as:  ARMOUR Take 30 mg by mouth 2 (two) times daily.   tiZANidine 2 MG tablet Commonly known as:   ZANAFLEX Take 1 tablet (2 mg total) by mouth every 8 (eight) hours as needed for muscle spasms.   traMADol 50 MG tablet Commonly known as:  ULTRAM Take 50 mg by mouth every 6 (six) hours as needed for moderate pain or severe pain.   zaleplon 10 MG capsule Commonly known as:  SONATA Take 10 mg by mouth at bedtime as needed for sleep.            Durable Medical Equipment  (From admission, onward)        Start     Ordered   08/05/17 1032  For home use only DME Walker rolling  Once    Question:  Patient needs a walker to treat with the following condition  Answer:  S/P total hip arthroplasty   08/05/17 1032       Discharge Care Instructions  (From admission, onward)        Start     Ordered   08/05/17 0000  Weight bearing as tolerated    Question Answer Comment  Laterality right   Extremity Lower      08/05/17 0925      Diagnostic Studies: Dg Chest 2 View  Result Date: 08/01/2017 CLINICAL DATA:  Preop for hip replacement history of hypertension EXAM: CHEST - 2 VIEW COMPARISON:  None. FINDINGS: No focal pulmonary infiltrate or effusion. Minimal scarring at the lingula. Clips over the left breast. No pleural effusion. Normal heart size. No pneumothorax. IMPRESSION: No active cardiopulmonary disease. Electronically Signed   By: Donavan Foil M.D.   On: 08/01/2017 22:48   Dg C-arm 1-60 Min-no Report  Result Date: 08/04/2017 Fluoroscopy was utilized by the requesting physician.  No radiographic interpretation.   Dg Hip Operative Unilat W Or W/o Pelvis Right  Result Date: 08/04/2017 CLINICAL DATA:  Primary osteoarthritis of the right hip. EXAM: OPERATIVE RIGHT HIP (WITH PELVIS IF PERFORMED) 1 VIEW; RF- DG C-ARM 1-60 MIN TECHNIQUE: Fluoroscopic spot image(s) were submitted for interpretation post-operatively. COMPARISON:  None. FINDINGS: AP C-arm images demonstrate that the acetabular and femoral components of the right total hip prosthesis appear in excellent position in the  AP projection. No fractures. IMPRESSION: Satisfactory appearance of the right total hip prosthesis in the AP projection. FLUOROSCOPY  TIME:  22.7 seconds.  1.29 mGy. C-arm fluoroscopic images were obtained intraoperatively and submitted for post operative interpretation. Electronically Signed   By: Lorriane Shire M.D.   On: 08/04/2017 09:37    Disposition: 01-Home or Self Care  Discharge Instructions    Call MD / Call 911   Complete by:  As directed    If you experience chest pain or shortness of breath, CALL 911 and be transported to the hospital emergency room.  If you develope a fever above 101 F, pus (white drainage) or increased drainage or redness at the wound, or calf pain, call your surgeon's office.   Diet general   Complete by:  As directed    Do not sit on low chairs, stoools or toilet seats, as it may be difficult to get up from low surfaces   Complete by:  As directed    Face-to-face encounter (required for Medicare/Medicaid patients)   Complete by:  As directed    I Dahiana Kulak G certify that this patient is under my care and that I, or a nurse practitioner or physician's assistant working with me, had a face-to-face encounter that meets the physician face-to-face encounter requirements with this patient on 08/05/2017. The encounter with the patient was in whole, or in part for the following medical condition(s) which is the primary reason for home health care (List medical condition):s/p right total hip arthroplasty   The encounter with the patient was in whole, or in part, for the following medical condition, which is the primary reason for home health care:  status post right total hip arthroplasty   I certify that, based on my findings, the following services are medically necessary home health services:  Physical therapy   Reason for Medically Necessary Home Health Services:  Therapy- Personnel officer, Public librarian   My clinical findings support the need for  the above services:  Pain interferes with ambulation/mobility   Further, I certify that my clinical findings support that this patient is homebound due to:  Pain interferes with ambulation/mobility   Home Health   Complete by:  As directed    To provide the following care/treatments:  PT   Increase activity slowly as tolerated   Complete by:  As directed    Weight bearing as tolerated   Complete by:  As directed    Laterality:  right   Extremity:  Lower      Follow-up Information    Dorna Leitz, MD. Schedule an appointment as soon as possible for a visit in 2 week(s).   Specialty:  Orthopedic Surgery Contact information: [place] [address] [place], [facility] Follow up.   Specialty:  [place] Why:  [place] will call to arrange iniital visit Contact information: [address] [place] [address] [phone]        [place] Follow up.   Why:  rolling walker will be delivered to your room prior to discharge  Contact information: [address] [place] [address] [phone]            Signed: Erlene Senters 08/05/2017, 4:48 PM

## 2017-08-05 NOTE — Plan of Care (Signed)
Patient progressing very well.  Ambulating with minimal assistance.  Voiding well after catheter removal

## 2017-08-05 NOTE — Progress Notes (Signed)
Physical Therapy Treatment Patient Details Name: Erin Barnes MRN: [id number] DOB: 10-19-1955 Today's Date: 08/05/2017    History of Present Illness 62 yo female s/p R THA-direct anterior 08/04/17    PT Comments    Progressing with mobility. Reviewed exercises and gait training. Spoke with pt about not rushing things and to still be cautious and safe when mobilizing. Recommended continued use of RW for safe ambulation until HHPT or MD clears her to transition to cane. All education completed. Okay to d/c from PT standpoint.     Follow Up Recommendations  Follow surgeon's recommendation for DC plan and follow-up therapies     Equipment Recommendations  Rolling walker with 5" wheels    Recommendations for Other Services       Precautions / Restrictions Precautions Precautions: Fall Restrictions Weight Bearing Restrictions: No    Mobility  Bed Mobility               General bed mobility comments: oob in recliner  Transfers Overall transfer level: Needs assistance Equipment used: Rolling walker (2 wheeled) Transfers: Sit to/from Stand Sit to Stand: Supervision         General transfer comment: for safety. Pt impulsive   Ambulation/Gait Ambulation/Gait assistance: Supervision Ambulation Distance (Feet): 115 Feet Assistive device: Rolling walker (2 wheeled) Gait Pattern/deviations: Step-to pattern;Step-through pattern;Decreased stride length     General Gait Details: cues for safety. Pt moves quickly.    Stairs            Wheelchair Mobility    Modified Rankin (Stroke Patients Only)       Balance                                            Cognition Arousal/Alertness: Awake/alert Behavior During Therapy: WFL for tasks assessed/performed Overall Cognitive Status: Within Functional Limits for tasks assessed                                        Exercises Total Joint Exercises Ankle Circles/Pumps:  AROM;Both;Supine;10 reps Quad Sets: AROM;Both;5 reps;Supine Heel Slides: AAROM;Right;10 reps;Supine Hip ABduction/ADduction: Right;Supine;10 reps;AROM(10 reps sitting, 10 reps standing) Long Arc Quad: AROM;10 reps;Right;Seated Knee Flexion: AROM;Right;10 reps;Standing Marching in Standing: AROM;Both;10 reps;Standing    General Comments        Pertinent Vitals/Pain Pain Assessment: 0-10 Pain Score: 4  Pain Location: R hip Pain Descriptors / Indicators: Sore Pain Intervention(s): Monitored during session;Ice applied    Home Living                      Prior Function            PT Goals (current goals can now be found in the care plan section) Progress towards PT goals: Progressing toward goals    Frequency    7X/week      PT Plan Current plan remains appropriate    Co-evaluation              AM-PAC PT "6 Clicks" Daily Activity  Outcome Measure  Difficulty turning over in bed (including adjusting bedclothes, sheets and blankets)?: None Difficulty moving from lying on back to sitting on the side of the bed? : None Difficulty sitting down on and standing up from a chair with arms (e.g., wheelchair, bedside  commode, etc,.)?: A Little Help needed moving to and from a bed to chair (including a wheelchair)?: A Little Help needed walking in hospital room?: A Little Help needed climbing 3-5 steps with a railing? : A Little 6 Click Score: 20    End of Session   Activity Tolerance: Patient tolerated treatment well Patient left: in chair;with call bell/phone within reach   PT Visit Diagnosis: Muscle weakness (generalized) (M62.81);Difficulty in walking, not elsewhere classified (R26.2) Pain - Right/Left: Right Pain - part of body: Hip     Time: 1213-1226 PT Time Calculation (min) (ACUTE ONLY): 13 min  Charges:  $Gait Training: 8-22 mins                    G Codes:          Weston Anna, MPT Pager: [phone]

## 2017-08-05 NOTE — Progress Notes (Signed)
Subjective: 1 Day Post-Op Procedure(s) (LRB): RIGHT TOTAL HIP ARTHROPLASTY ANTERIOR APPROACH (Right) Patient reports pain as mild.    Objective: Vital signs in last 24 hours: Temp:  [97.6 F (36.4 C)-98.6 F (37 C)] 98.3 F (36.8 C) (03/09 0550) Pulse Rate:  [66-88] 85 (03/09 0550) Resp:  [15-18] 18 (03/09 0550) BP: (90-155)/(58-86) 130/72 (03/09 0550) SpO2:  [94 %-100 %] 94 % (03/09 0550)  Intake/Output from previous day: 03/08 0701 - 03/09 0700 In: 45038 [P.O.:24545; I.V.:2800; IV Piggyback:250] Out: 2900 [Urine:2400; Blood:500] Intake/Output this shift: No intake/output data recorded.  Recent Labs    08/04/17 0622 08/05/17 0527  HGB 11.9* 9.9*   Recent Labs    08/04/17 0622 08/05/17 0527  WBC  --  10.1  RBC  --  2.90*  HCT 35.0* 28.7*  PLT  --  181   Recent Labs    08/04/17 0622  NA 139  K 4.4  GLUCOSE 92   No results for input(s): LABPT, INR in the last 72 hours.  Neurologically intact ABD soft Neurovascular intact Sensation intact distally Intact pulses distally Dorsiflexion/Plantar flexion intact No cellulitis present Compartment soft  Assessment/Plan: 1 Day Post-Op Procedure(s) (LRB): RIGHT TOTAL HIP ARTHROPLASTY ANTERIOR APPROACH (Right) Advance diet Up with therapy Discharge home with home health  The patient is doing quite well and I think will be safe for discharge today.  She will follow-up in our office in 2 weeks.  She knows to contact us sooner should issues arise.  Erin Barnes 08/05/2017, 8:31 AM

## 2017-08-07 NOTE — Op Note (Signed)
NAME:  Erin Barnes, Erin Barnes               ACCOUNT NO.:  [id number]  MEDICAL RECORD NO.:  [id number]  LOCATION:                                 FACILITY:  PHYSICIAN:  Alta Corning, M.D.   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  PROCEDURES PERFORMED: 1. Right total hip replacement with a Corail stem size 11, 48-mm     Gription cup, a +0 delta ceramic hip ball 32 mm, and a +4 neutral     liner for a 32-mm ball. 2. Interpretation of multiple intraoperative fluoroscopic images.  SURGEON:  Alta Corning, MD.  ASSISTANT:  Gary Fleet, PA.  ANESTHESIA:  Spinal.  BRIEF HISTORY:  [name] is a 62 year old female with a long history of significant complaints of right hip pain.  She had been treated conservatively for a prolonged period of time.  After failure of all conservative care, she was taken to the operating room for right total hip replacement.  X-ray showed bone-on-bone change.  She was having night pain and light activity pain, and after failure of all conservative care, she was taken here for this procedure.  DESCRIPTION OF PROCEDURE:  The patient was taken to the operating room. After adequate anesthesia was obtained with general anesthetic, the patient was placed supine on the operating table.  She was moved onto the [place] bed.  All bony prominences were well padded, and imaging was assessed at that point.  Following this, the hip was prepped and draped for an anterior exposure to the hip and subcutaneous tissues down to the level of the tensor fascia.  It was divided in line with its fibers, and the muscle was then finger-dissected back, and retractors were put in place  above and below the neck.  The capsule was opened and tagged.  A provisional neck cut was then made, and the head was removed. Retractors were placed in front and back of the hip, and the acetabulum was sequentially rasped to a level of 47 mm.  A 48-mm Gription cup was placed, with 45 degrees of lateral opening and 30 degrees of anteversion.  Following this, a hole eliminator was placed.  Following this, +4 neutral liner was placed.  Following that, attention was turned to the stem side.  The posterior capsule was released.  A rongeur and cookie cutter were used to open the posterior aspect, and the hip was sequentially rasped up to a level of 10.  Reduction was undertaken at that point with perfect anatomic leg length and perfect appearance.  We then dislocated the hip and pulled it out of the rasp at this point.  Unfortunately, it was wobbling a little bit.  I thought it was too much to accept, and so we went to the #11 rasp.  We were able to get the rasp all the way down to the neutral position, and following this, we opened the 11 and put  it. Unfortunately, I could only get it down to within a couple of millimeters to the calcar.  We tried increased hitting of this, but it was just not able to move it down any further.  At that point, we felt like we had to accept that, although we knew she would be a couple of millimeters long on the affected side.  At this point, we put up the ceramic hip ball in place and did a reduction of the hip.  Anatomic reduction was achieved, and the images were taken.  The wound was then copiously irrigated and suctioned dry.  Final fluoroscopic images were taken at this point.  WOUND CLOSURE:  Once this was done, the capsule was closed anteriorly, and the tensor fascia was closed with 0 Vicryl running, and skin with 0 and 2-0 Vicryl and 3-0 Monocryl subcuticular.  Benzoin and Steri-Strips were applied.  A sterile compressive dressing was  applied.  CONDITION:  The patient was taken to the recovery room and was noted to be in satisfactory condition.  ESTIMATED BLOOD LOSS:  The estimated blood loss for the procedure was 500 mL, the final can be gotten from the anesthetic record.     Alta Corning, M.D.     Corliss Skains  D:  08/04/2017  T:  08/05/2017  Job:  [id number]

## 2017-08-17 ENCOUNTER — Other Ambulatory Visit: Payer: Self-pay | Admitting: Hematology and Oncology

## 2017-08-17 DIAGNOSIS — C50412 Malignant neoplasm of upper-outer quadrant of left female breast: Secondary | ICD-10-CM

## 2017-09-16 ENCOUNTER — Other Ambulatory Visit: Payer: Self-pay | Admitting: Hematology and Oncology

## 2017-09-16 DIAGNOSIS — C50412 Malignant neoplasm of upper-outer quadrant of left female breast: Secondary | ICD-10-CM

## 2017-09-27 ENCOUNTER — Ambulatory Visit: Payer: BLUE CROSS/BLUE SHIELD | Admitting: Nurse Practitioner

## 2017-10-06 ENCOUNTER — Telehealth: Payer: Self-pay | Admitting: Certified Nurse Midwife

## 2017-10-06 NOTE — Telephone Encounter (Signed)
Left message on voicemail to call and reschedule cancelled appointment. °

## 2017-10-17 ENCOUNTER — Other Ambulatory Visit: Payer: Self-pay | Admitting: Hematology and Oncology

## 2017-10-17 DIAGNOSIS — C50412 Malignant neoplasm of upper-outer quadrant of left female breast: Secondary | ICD-10-CM

## 2017-10-27 ENCOUNTER — Encounter: Payer: Self-pay | Admitting: Certified Nurse Midwife

## 2017-10-27 ENCOUNTER — Ambulatory Visit: Payer: Self-pay | Admitting: Certified Nurse Midwife

## 2017-10-27 NOTE — Progress Notes (Deleted)
62 y.o. G0P0000 Single  {Race/ethnicity:17218} Fe here for annual exam.    Patient's last menstrual period was 12/28/2009.          Sexually active: {yes no:314532}  The current method of family planning is {contraception:315051}.    Exercising: {yes no:314532}  {types:19826} Smoker:  {YES NO:22349}  Health Maintenance: Pap:  09-19-14 neg HPV HR neg, 09-26-16 neg HPV HR neg History of Abnormal Pap: {YES NO:22349} MMG:  03-09-17 category c density birads 2:neg Self Breast exams: {YES NO:22349} Colonoscopy:  2007 BMD:   2017 TDaP:  UTD with PCP Shingles: 2017 Pneumonia: *** Hep C and HIV: both neg 2017 Labs: ***   reports that she has never smoked. She has never used smokeless tobacco. She reports that she drinks about 12.0 oz of alcohol per week. She reports that she does not use drugs.  Past Medical History:  Diagnosis Date  . Anxiety   . Arthritis   . Breast cancer ([place]) 01/23/14   left insitu  . Depression   . Hypertension   . Hypothyroidism   . Postmenopausal HRT (hormone replacement therapy) 2011- 2015   Bio- identical HRT @ [facility]  . S/P radiation therapy 04/01/2014 through 05/12/2014                                                      Left breast 4800 cGy in 24 sessions, left breast boost 1000 cGy in 5 sessions                          . Wears contact lenses     Past Surgical History:  Procedure Laterality Date  . BREAST LUMPECTOMY WITH RADIOACTIVE SEED LOCALIZATION Left 02/12/2014   Procedure: LEFT BREAST RADIOACTIVE SEED LOCALIZATION LUMPECTOMY ;  Surgeon: Autumn Messing III, MD;  Location: [facility];  Service: General;  Laterality: Left;  . COLONOSCOPY    . JOINT REPLACEMENT     Right total hip Dr. Berenice Primas 08-04-17  . TOTAL HIP ARTHROPLASTY Right 08/04/2017   Procedure: RIGHT TOTAL HIP ARTHROPLASTY ANTERIOR APPROACH;  Surgeon: Dorna Leitz, MD;  Location: [facility];  Service: Orthopedics;  Laterality: Right;  . URETHRAL DILATION    . WISDOM TOOTH EXTRACTION       Current Outpatient Medications  Medication Sig Dispense Refill  . ALPRAZolam (XANAX) 0.5 MG tablet Take 0.5 mg by mouth at bedtime as needed for anxiety.    Marland Kitchen aspirin EC 325 MG tablet Take 1 tablet (325 mg total) by mouth 2 (two) times daily after a meal. Take x 1 month post op to decrease risk of blood clots. 60 tablet 0  . Biotin 5000 MCG CAPS Take 1 capsule by mouth daily.    . Calcium Carbonate (CALCIUM 600 PO) Take 1 tablet by mouth daily.    . celecoxib (CELEBREX) 200 MG capsule Take 200 mg by mouth 2 (two) times daily.    . cholecalciferol (VITAMIN D) 1000 units tablet Take 2,000 Units by mouth daily.    Marland Kitchen docusate sodium (COLACE) 100 MG capsule Take 1 capsule (100 mg total) by mouth 2 (two) times daily. 30 capsule 0  . escitalopram (LEXAPRO) 20 MG tablet Take 20 mg by mouth daily.    Marland Kitchen HYDROcodone-acetaminophen (NORCO) 5-325 MG tablet Take 1-2 tablets by mouth every  6 (six) hours as needed for moderate pain. 50 tablet 0  . Milk Thistle 200 MG CAPS Take 200 mg by mouth 3 (three) times daily.     . nebivolol (BYSTOLIC) 10 MG tablet Take 10 mg by mouth daily.    . tamoxifen (NOLVADEX) 20 MG tablet TAKE ONE TABLET BY MOUTH DAILY **MUST CALL MD FOR APPOINTMENT 30 tablet 0  . thyroid (ARMOUR) 30 MG tablet Take 30 mg by mouth 2 (two) times daily.     Marland Kitchen tiZANidine (ZANAFLEX) 2 MG tablet Take 1 tablet (2 mg total) by mouth every 8 (eight) hours as needed for muscle spasms. 50 tablet 0  . traMADol (ULTRAM) 50 MG tablet Take 50 mg by mouth every 6 (six) hours as needed for moderate pain or severe pain.     . zaleplon (SONATA) 10 MG capsule Take 10 mg by mouth at bedtime as needed for sleep.     No current facility-administered medications for this visit.     Family History  Problem Relation Age of Onset  . Prostate cancer Father 83  . Arthritis Mother   . Osteoporosis Mother        hip fracture    ROS:  Pertinent items are noted in HPI.  Otherwise, a comprehensive ROS was  negative.  Exam:   LMP 12/28/2009    Ht Readings from Last 3 Encounters:  08/04/17 5\' 4"  (1.626 m)  08/01/17 5\' 4"  (1.626 m)  07/11/16 5' 3.75" (1.619 m)    General appearance: alert, cooperative and appears stated age Head: Normocephalic, without obvious abnormality, atraumatic Neck: no adenopathy, supple, symmetrical, trachea midline and thyroid {EXAM; THYROID:18604} Lungs: clear to auscultation bilaterally Breasts: {Exam; breast:13139::"normal appearance, no masses or tenderness"} Heart: regular rate and rhythm Abdomen: soft, non-tender; no masses,  no organomegaly Extremities: extremities normal, atraumatic, no cyanosis or edema Skin: Skin color, texture, turgor normal. No rashes or lesions Lymph nodes: Cervical, supraclavicular, and axillary nodes normal. No abnormal inguinal nodes palpated Neurologic: Grossly normal   Pelvic: External genitalia:  no lesions              Urethra:  normal appearing urethra with no masses, tenderness or lesions              Bartholin's and Skene's: normal                 Vagina: normal appearing vagina with normal color and discharge, no lesions              Cervix: {exam; cervix:14595}              Pap taken: {yes no:314532} Bimanual Exam:  Uterus:  {exam; uterus:12215}              Adnexa: {exam; adnexa:12223}               Rectovaginal: Confirms               Anus:  normal sphincter tone, no lesions  Chaperone present: ***  A:  Well Woman with normal exam  P:   Reviewed health and wellness pertinent to exam  Pap smear: {YES NO:22349}  {plan; gyn:5269::"mammogram","pap smear","return annually or prn"}  An After Visit Summary was printed and given to the patient.

## 2017-11-15 ENCOUNTER — Other Ambulatory Visit: Payer: Self-pay | Admitting: Hematology and Oncology

## 2017-11-15 DIAGNOSIS — C50412 Malignant neoplasm of upper-outer quadrant of left female breast: Secondary | ICD-10-CM

## 2017-11-21 ENCOUNTER — Ambulatory Visit: Payer: BLUE CROSS/BLUE SHIELD | Admitting: Certified Nurse Midwife

## 2019-08-21 ENCOUNTER — Encounter: Payer: Self-pay | Admitting: Certified Nurse Midwife

## 2020-12-22 ENCOUNTER — Ambulatory Visit (INDEPENDENT_AMBULATORY_CARE_PROVIDER_SITE_OTHER): Payer: 59 | Admitting: Neurology

## 2020-12-22 ENCOUNTER — Encounter: Payer: Self-pay | Admitting: Neurology

## 2020-12-22 ENCOUNTER — Other Ambulatory Visit: Payer: Self-pay

## 2020-12-22 VITALS — Ht 63.0 in

## 2020-12-22 DIAGNOSIS — R413 Other amnesia: Secondary | ICD-10-CM | POA: Diagnosis not present

## 2020-12-22 NOTE — Progress Notes (Signed)
Chief Complaint  Patient presents with   New Patient (Initial Visit)    Room 12, alone, here for memory concerns,  States she forgets what she is trying to say, has to write everything down. States this worsen  last year, she feels like it is stress related. She is concerned because mother and grandmother had Dementia       ASSESSMENT AND PLAN  Erin Barnes is a 65 y.o. female   Mild cognitive impairment  In the setting of extreme stress, lost her mother and brother within 6 months back in 2021  MoCA examination 27 out of 27  Laboratory evaluation to rule out treatable etiology  MRI of brain  Will call her report     DIAGNOSTIC DATA (LABS, IMAGING, TESTING) - I reviewed patient records, labs, notes, testing and imaging myself where available.   MEDICAL HISTORY:  Erin Barnes, is a 65 year old female seen in request by   her primary care physician Dr. Tamala Julian, [facility] for evaluation of memory loss, initial evaluation was on December 22, 2020    I reviewed and summarized the referring note.  Past medical history Depression anxiety, has been on stable dose of Lexapro, Xanax, Sonata for long time, History of breast cancer, Hypertension  She is currently still working as a Surveyor, quantity, she has been doing the same job for 36 years, she went extreme stress over the past couple years, besides dealing with pandemic, and associated work-related stress, she lost her mother and brother within 8 months span in 2021, she already has depression anxiety, chronic insomnia,  reported reasonable control with current medications, Lexapro 20 mg every morning, Xanax as needed,  She denies focal signs, maternal grandmother suffered memory loss  PHYSICAL EXAM:   Vitals:   12/22/20 1337  Height: '5\' 3"'$  (1.6 m)   Not recorded     Body mass index is 32.06 kg/m.  PHYSICAL EXAMNIATION:  Gen: NAD, conversant, well nourised, well groomed                     Cardiovascular:  Regular rate rhythm, no peripheral edema, warm, nontender. Eyes: Conjunctivae clear without exudates or hemorrhage Neck: Supple, no carotid bruits. Pulmonary: Clear to auscultation bilaterally   NEUROLOGICAL EXAM:  MENTAL STATUS: Speech:    Speech is normal; fluent and spontaneous with normal comprehension.  Cognition:  Montreal Cognitive Assessment  12/22/2020  Visuospatial/ Executive (0/5) 5  Naming (0/3) 3  Attention: Read list of digits (0/2) 2  Attention: Read list of letters (0/1) 1  Attention: Serial 7 subtraction starting at 100 (0/3) 3  Language: Repeat phrase (0/2) 1  Language : Fluency (0/1) 1  Abstraction (0/2) 2  Delayed Recall (0/5) 3  Orientation (0/6) 6  Total 27      CRANIAL NERVES: CN II: Visual fields are full to confrontation. Pupils are round equal and briskly reactive to light. CN III, IV, VI: extraocular movement are normal. No ptosis. CN V: Facial sensation is intact to light touch CN VII: Face is symmetric with normal eye closure  CN VIII: Hearing is normal to causal conversation. CN IX, X: Phonation is normal. CN XI: Head turning and shoulder shrug are intact  MOTOR: There is no pronator drift of out-stretched arms. Muscle bulk and tone are normal. Muscle strength is normal.  REFLEXES: Reflexes are 2+ and symmetric at the biceps, triceps, knees, and ankles. Plantar responses are flexor.  SENSORY: Intact to light touch, pinprick and vibratory sensation  are intact in fingers and toes.  COORDINATION: There is no trunk or limb dysmetria noted.  GAIT/STANCE: Posture is normal. Gait is steady with normal steps, base, arm swing, and turning. Heel and toe walking are normal. Tandem gait is normal.  Romberg is absent.  REVIEW OF SYSTEMS:  Full 14 system review of systems performed and notable only for as above All other review of systems were negative.   ALLERGIES: No Known Allergies  HOME MEDICATIONS: Current Outpatient Medications   Medication Sig Dispense Refill   aspirin EC 325 MG tablet Take 1 tablet (325 mg total) by mouth 2 (two) times daily after a meal. Take x 1 month post op to decrease risk of blood clots. 60 tablet 0   Biotin 5000 MCG CAPS Take 1 capsule by mouth daily.     Calcium Carbonate (CALCIUM 600 PO) Take 1 tablet by mouth daily.     celecoxib (CELEBREX) 200 MG capsule Take 200 mg by mouth 2 (two) times daily.     cholecalciferol (VITAMIN D) 1000 units tablet Take 2,000 Units by mouth daily.     docusate sodium (COLACE) 100 MG capsule Take 1 capsule (100 mg total) by mouth 2 (two) times daily. 30 capsule 0   escitalopram (LEXAPRO) 20 MG tablet Take 20 mg by mouth daily.     Milk Thistle 200 MG CAPS Take 200 mg by mouth 3 (three) times daily.      nebivolol (BYSTOLIC) 10 MG tablet Take 10 mg by mouth daily.     thyroid (ARMOUR) 30 MG tablet Take 30 mg by mouth 2 (two) times daily.      traMADol (ULTRAM) 50 MG tablet Take 50 mg by mouth every 6 (six) hours as needed for moderate pain or severe pain.      zaleplon (SONATA) 10 MG capsule Take 10 mg by mouth at bedtime as needed for sleep.     No current facility-administered medications for this visit.    PAST MEDICAL HISTORY: Past Medical History:  Diagnosis Date   Anxiety    Arthritis    Breast cancer ([place]) 01/23/14   left insitu   Depression    Hypertension    Hypothyroidism    Postmenopausal HRT (hormone replacement therapy) 2011- 2015   Bio- identical HRT @ [facility]   S/P radiation therapy 04/01/2014 through 05/12/2014                                                      Left breast 4800 cGy in 24 sessions, left breast boost 1000 cGy in 5 sessions                           Wears contact lenses     PAST SURGICAL HISTORY: Past Surgical History:  Procedure Laterality Date   BREAST LUMPECTOMY WITH RADIOACTIVE SEED LOCALIZATION Left 02/12/2014   Procedure: LEFT BREAST RADIOACTIVE SEED LOCALIZATION LUMPECTOMY ;  Surgeon: Autumn Messing III, MD;   Location: [facility];  Service: General;  Laterality: Left;   COLONOSCOPY     JOINT REPLACEMENT     Right total hip Dr. Berenice Primas 08-04-17   TOTAL HIP ARTHROPLASTY Right 08/04/2017   Procedure: RIGHT TOTAL HIP ARTHROPLASTY ANTERIOR APPROACH;  Surgeon: Dorna Leitz, MD;  Location: [facility];  Service: Orthopedics;  Laterality: Right;   URETHRAL DILATION     WISDOM TOOTH EXTRACTION      FAMILY HISTORY: Family History  Problem Relation Age of Onset   Dementia Mother    Arthritis Mother    Osteoporosis Mother        hip fracture   Prostate cancer Father 64   Dementia Maternal Grandmother     SOCIAL HISTORY: Social History   Socioeconomic History   Marital status: Single    Spouse name: Not on file   Number of children: Not on file   Years of education: Not on file   Highest education level: Not on file  Occupational History   Not on file  Tobacco Use   Smoking status: Never   Smokeless tobacco: Never  Vaping Use   Vaping Use: Never used  Substance and Sexual Activity   Alcohol use: Yes    Alcohol/week: 20.0 standard drinks    Types: 20 Cans of beer per week   Drug use: No   Sexual activity: Not on file    Comment: menarche age 43, P0  Other Topics Concern   Not on file  Social History Narrative   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  She does travel with her job.   Erma Pinto   Social Determinants of Health   Financial Resource Strain: Not on file  Food Insecurity: Not on file  Transportation Needs: Not on file  Physical Activity: Not on file  Stress: Not on file  Social Connections: Not on file  Intimate Partner Violence: Not on file      Marcial Pacas, M.D. Ph.D.  [facility] [address], [place], [place] [address] Ph: [phone] Fax: [phone]  CC:  Carol Ada, [place],  [place] [address]  Carol Ada, MD

## 2020-12-23 ENCOUNTER — Telehealth: Payer: Self-pay | Admitting: Neurology

## 2020-12-23 NOTE — Telephone Encounter (Signed)
MRI brain wo contrast [facility] auth: [id number] (exp. 02/06/21) GI obtains [employer]  Sent to GI for scheduling

## 2020-12-24 ENCOUNTER — Other Ambulatory Visit (INDEPENDENT_AMBULATORY_CARE_PROVIDER_SITE_OTHER): Payer: Self-pay

## 2020-12-24 ENCOUNTER — Ambulatory Visit: Payer: 59 | Admitting: Neurology

## 2020-12-24 DIAGNOSIS — Z0289 Encounter for other administrative examinations: Secondary | ICD-10-CM

## 2020-12-25 LAB — COMPREHENSIVE METABOLIC PANEL
ALT: 22 IU/L (ref 0–32)
AST: 22 IU/L (ref 0–40)
Albumin/Globulin Ratio: 2 (ref 1.2–2.2)
Albumin: 4.7 g/dL (ref 3.8–4.8)
Alkaline Phosphatase: 80 IU/L (ref 44–121)
BUN/Creatinine Ratio: 9 — ABNORMAL LOW (ref 12–28)
BUN: 9 mg/dL (ref 8–27)
Bilirubin Total: 0.5 mg/dL (ref 0.0–1.2)
CO2: 20 mmol/L (ref 20–29)
Calcium: 10.5 mg/dL — ABNORMAL HIGH (ref 8.7–10.3)
Chloride: 93 mmol/L — ABNORMAL LOW (ref 96–106)
Creatinine, Ser: 0.98 mg/dL (ref 0.57–1.00)
Globulin, Total: 2.4 g/dL (ref 1.5–4.5)
Glucose: 131 mg/dL — ABNORMAL HIGH (ref 65–99)
Potassium: 4.5 mmol/L (ref 3.5–5.2)
Sodium: 134 mmol/L (ref 134–144)
Total Protein: 7.1 g/dL (ref 6.0–8.5)
eGFR: 64 mL/min/{1.73_m2} (ref >59)

## 2020-12-25 LAB — CBC WITH DIFFERENTIAL/PLATELET
Basophils Absolute: 0 10*3/uL (ref 0.0–0.2)
Basos: 0 %
EOS (ABSOLUTE): 0 10*3/uL (ref 0.0–0.4)
Eos: 0 %
Hematocrit: 36.5 % (ref 34.0–46.6)
Hemoglobin: 12.7 g/dL (ref 11.1–15.9)
Immature Grans (Abs): 0 10*3/uL (ref 0.0–0.1)
Immature Granulocytes: 0 %
Lymphocytes Absolute: 0.6 10*3/uL — ABNORMAL LOW (ref 0.7–3.1)
Lymphs: 5 %
MCH: 32.5 pg (ref 26.6–33.0)
MCHC: 34.8 g/dL (ref 31.5–35.7)
MCV: 93 fL (ref 79–97)
Monocytes Absolute: 0.5 10*3/uL (ref 0.1–0.9)
Monocytes: 5 %
Neutrophils Absolute: 10.1 10*3/uL — ABNORMAL HIGH (ref 1.4–7.0)
Neutrophils: 90 %
Platelets: 282 10*3/uL (ref 150–450)
RBC: 3.91 x10E6/uL (ref 3.77–5.28)
RDW: 12.1 % (ref 11.7–15.4)
WBC: 11.2 10*3/uL — ABNORMAL HIGH (ref 3.4–10.8)

## 2020-12-25 LAB — VITAMIN B12: Vitamin B-12: 137 pg/mL — ABNORMAL LOW (ref 232–1245)

## 2020-12-25 LAB — TSH: TSH: 0.436 u[IU]/mL — ABNORMAL LOW (ref 0.450–4.500)

## 2020-12-25 LAB — RPR: RPR Ser Ql: NONREACTIVE

## 2020-12-27 ENCOUNTER — Ambulatory Visit
Admission: RE | Admit: 2020-12-27 | Discharge: 2020-12-27 | Disposition: A | Discharge status: Home / Self Care | Payer: Managed Care, Other (non HMO) | Source: Ambulatory Visit | Attending: Neurology | Admitting: Neurology

## 2020-12-27 DIAGNOSIS — R413 Other amnesia: Secondary | ICD-10-CM

## 2020-12-28 ENCOUNTER — Telehealth: Payer: Self-pay | Admitting: Neurology

## 2020-12-28 NOTE — Telephone Encounter (Signed)
Pt has been added to the nurse schedule for a B12 injection of 1036mg (11am on 12/29/20). She verbalized understanding to then start oral supplements of 10059m per day.

## 2020-12-28 NOTE — Telephone Encounter (Signed)
I called the patient.  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  There is a mild to moderate level small vessel disease, patient is on Bystolic for blood pressure but claims that her systolic pressures run around 120.  She will go on a baby aspirin daily, 81 mg.  With her blood work, the vitamin B12 level was low, we will bring her in for B12 injection.  She will then go on 1000 mcg tablets of B12 daily.  The TSH was slightly low, she is on Armour Thyroid.  I will send the blood work report to the primary doctor.  The patient had a slightly low chloride level and a slightly high calcium level.  She currently is not on calcium supplementation.   MRI brain 12/27/20:  IMPRESSION: This MRI of the brain without contrast shows the following: 1.   CSF spaces generous over the frontal and prefrontal convexities.  This could be due to to atrophy.  However, since ventricles are fairly normal in size for age, this finding could also be due to bilateral hygromas. 2.   T2/FLAIR hyperintense foci in the hemispheres and pons consistent with chronic microvascular ischemic change, more than expected for age. 3,   No acute findings.

## 2020-12-29 ENCOUNTER — Other Ambulatory Visit: Payer: Self-pay | Admitting: Emergency Medicine

## 2020-12-29 ENCOUNTER — Ambulatory Visit (INDEPENDENT_AMBULATORY_CARE_PROVIDER_SITE_OTHER): Payer: 59 | Admitting: Emergency Medicine

## 2020-12-29 DIAGNOSIS — E538 Deficiency of other specified B group vitamins: Secondary | ICD-10-CM

## 2020-12-29 DIAGNOSIS — R413 Other amnesia: Secondary | ICD-10-CM

## 2020-12-29 MED ORDER — CYANOCOBALAMIN 1000 MCG/ML IJ SOLN
1000.0000 ug | Freq: Once | INTRAMUSCULAR | Status: AC
  Administered 2020-12-29: 1000 ug via INTRAMUSCULAR

## 2020-12-29 NOTE — Progress Notes (Addendum)
VO given from Dr. Jannifer Franklin for B12, patient verified by name and DOB.  Explained procedure to patient. B12 given in right deltoid, tolerated injection well.  Bandaid applied.

## 2022-01-27 DIAGNOSIS — M47816 Spondylosis without myelopathy or radiculopathy, lumbar region: Secondary | ICD-10-CM | POA: Diagnosis not present

## 2022-02-28 DIAGNOSIS — G479 Sleep disorder, unspecified: Secondary | ICD-10-CM | POA: Diagnosis not present

## 2022-02-28 DIAGNOSIS — R69 Illness, unspecified: Secondary | ICD-10-CM | POA: Diagnosis not present

## 2022-02-28 DIAGNOSIS — K219 Gastro-esophageal reflux disease without esophagitis: Secondary | ICD-10-CM | POA: Diagnosis not present

## 2022-02-28 DIAGNOSIS — G894 Chronic pain syndrome: Secondary | ICD-10-CM | POA: Diagnosis not present

## 2022-02-28 DIAGNOSIS — I10 Essential (primary) hypertension: Secondary | ICD-10-CM | POA: Diagnosis not present

## 2022-04-15 DIAGNOSIS — Z0100 Encounter for examination of eyes and vision without abnormal findings: Secondary | ICD-10-CM | POA: Diagnosis not present

## 2022-05-19 DIAGNOSIS — N644 Mastodynia: Secondary | ICD-10-CM | POA: Diagnosis not present

## 2022-05-19 DIAGNOSIS — R922 Inconclusive mammogram: Secondary | ICD-10-CM | POA: Diagnosis not present

## 2022-07-22 DIAGNOSIS — M47816 Spondylosis without myelopathy or radiculopathy, lumbar region: Secondary | ICD-10-CM | POA: Diagnosis not present

## 2022-07-28 DIAGNOSIS — M47816 Spondylosis without myelopathy or radiculopathy, lumbar region: Secondary | ICD-10-CM | POA: Diagnosis not present

## 2022-09-01 DIAGNOSIS — H5213 Myopia, bilateral: Secondary | ICD-10-CM | POA: Diagnosis not present

## 2022-10-04 DIAGNOSIS — J40 Bronchitis, not specified as acute or chronic: Secondary | ICD-10-CM | POA: Diagnosis not present

## 2022-10-10 DIAGNOSIS — Z0100 Encounter for examination of eyes and vision without abnormal findings: Secondary | ICD-10-CM | POA: Diagnosis not present

## 2022-11-23 DIAGNOSIS — I10 Essential (primary) hypertension: Secondary | ICD-10-CM | POA: Diagnosis not present

## 2022-11-23 DIAGNOSIS — G894 Chronic pain syndrome: Secondary | ICD-10-CM | POA: Diagnosis not present

## 2022-11-23 DIAGNOSIS — K219 Gastro-esophageal reflux disease without esophagitis: Secondary | ICD-10-CM | POA: Diagnosis not present

## 2022-11-23 DIAGNOSIS — E785 Hyperlipidemia, unspecified: Secondary | ICD-10-CM | POA: Diagnosis not present

## 2022-11-23 DIAGNOSIS — G479 Sleep disorder, unspecified: Secondary | ICD-10-CM | POA: Diagnosis not present

## 2022-11-23 DIAGNOSIS — E039 Hypothyroidism, unspecified: Secondary | ICD-10-CM | POA: Diagnosis not present

## 2022-11-23 DIAGNOSIS — F411 Generalized anxiety disorder: Secondary | ICD-10-CM | POA: Diagnosis not present

## 2022-12-21 ENCOUNTER — Ambulatory Visit: Payer: Managed Care, Other (non HMO) | Admitting: Internal Medicine

## 2023-01-06 DIAGNOSIS — M47816 Spondylosis without myelopathy or radiculopathy, lumbar region: Secondary | ICD-10-CM | POA: Diagnosis not present

## 2023-01-19 DIAGNOSIS — M67442 Ganglion, left hand: Secondary | ICD-10-CM | POA: Diagnosis not present

## 2023-01-24 DIAGNOSIS — S93402A Sprain of unspecified ligament of left ankle, initial encounter: Secondary | ICD-10-CM | POA: Diagnosis not present

## 2023-01-24 DIAGNOSIS — S8002XA Contusion of left knee, initial encounter: Secondary | ICD-10-CM | POA: Diagnosis not present

## 2023-01-24 DIAGNOSIS — S50311A Abrasion of right elbow, initial encounter: Secondary | ICD-10-CM | POA: Diagnosis not present

## 2023-02-07 DIAGNOSIS — L570 Actinic keratosis: Secondary | ICD-10-CM | POA: Diagnosis not present

## 2023-02-07 DIAGNOSIS — Z85828 Personal history of other malignant neoplasm of skin: Secondary | ICD-10-CM | POA: Diagnosis not present

## 2023-02-07 DIAGNOSIS — M47816 Spondylosis without myelopathy or radiculopathy, lumbar region: Secondary | ICD-10-CM | POA: Diagnosis not present

## 2023-02-07 DIAGNOSIS — I788 Other diseases of capillaries: Secondary | ICD-10-CM | POA: Diagnosis not present

## 2023-02-07 DIAGNOSIS — L821 Other seborrheic keratosis: Secondary | ICD-10-CM | POA: Diagnosis not present

## 2023-03-29 DIAGNOSIS — F5101 Primary insomnia: Secondary | ICD-10-CM | POA: Diagnosis not present

## 2023-03-29 DIAGNOSIS — Z0000 Encounter for general adult medical examination without abnormal findings: Secondary | ICD-10-CM | POA: Diagnosis not present

## 2023-03-29 DIAGNOSIS — K219 Gastro-esophageal reflux disease without esophagitis: Secondary | ICD-10-CM | POA: Diagnosis not present

## 2023-03-29 DIAGNOSIS — M8580 Other specified disorders of bone density and structure, unspecified site: Secondary | ICD-10-CM | POA: Diagnosis not present

## 2023-03-29 DIAGNOSIS — R9431 Abnormal electrocardiogram [ECG] [EKG]: Secondary | ICD-10-CM | POA: Diagnosis not present

## 2023-03-29 DIAGNOSIS — E2839 Other primary ovarian failure: Secondary | ICD-10-CM | POA: Diagnosis not present

## 2023-03-29 DIAGNOSIS — E039 Hypothyroidism, unspecified: Secondary | ICD-10-CM | POA: Diagnosis not present

## 2023-03-29 DIAGNOSIS — F419 Anxiety disorder, unspecified: Secondary | ICD-10-CM | POA: Diagnosis not present

## 2023-03-29 DIAGNOSIS — G894 Chronic pain syndrome: Secondary | ICD-10-CM | POA: Diagnosis not present

## 2023-03-29 DIAGNOSIS — I10 Essential (primary) hypertension: Secondary | ICD-10-CM | POA: Diagnosis not present

## 2023-03-29 DIAGNOSIS — Z1331 Encounter for screening for depression: Secondary | ICD-10-CM | POA: Diagnosis not present

## 2023-03-29 DIAGNOSIS — Z1159 Encounter for screening for other viral diseases: Secondary | ICD-10-CM | POA: Diagnosis not present

## 2023-05-22 DIAGNOSIS — Z853 Personal history of malignant neoplasm of breast: Secondary | ICD-10-CM | POA: Diagnosis not present

## 2023-05-22 DIAGNOSIS — Z1231 Encounter for screening mammogram for malignant neoplasm of breast: Secondary | ICD-10-CM | POA: Diagnosis not present

## 2023-05-22 DIAGNOSIS — E2839 Other primary ovarian failure: Secondary | ICD-10-CM | POA: Diagnosis not present

## 2023-05-22 DIAGNOSIS — M8588 Other specified disorders of bone density and structure, other site: Secondary | ICD-10-CM | POA: Diagnosis not present

## 2023-05-22 DIAGNOSIS — Z8262 Family history of osteoporosis: Secondary | ICD-10-CM | POA: Diagnosis not present

## 2023-05-29 DIAGNOSIS — M47816 Spondylosis without myelopathy or radiculopathy, lumbar region: Secondary | ICD-10-CM | POA: Diagnosis not present

## 2023-06-23 ENCOUNTER — Ambulatory Visit (HOSPITAL_BASED_OUTPATIENT_CLINIC_OR_DEPARTMENT_OTHER): Payer: Managed Care, Other (non HMO) | Admitting: Cardiology

## 2023-06-25 DIAGNOSIS — J209 Acute bronchitis, unspecified: Secondary | ICD-10-CM | POA: Diagnosis not present

## 2023-06-25 DIAGNOSIS — R0981 Nasal congestion: Secondary | ICD-10-CM | POA: Diagnosis not present

## 2023-06-25 DIAGNOSIS — R051 Acute cough: Secondary | ICD-10-CM | POA: Diagnosis not present

## 2023-06-27 DIAGNOSIS — M47816 Spondylosis without myelopathy or radiculopathy, lumbar region: Secondary | ICD-10-CM | POA: Diagnosis not present

## 2023-08-18 NOTE — Progress Notes (Unsigned)
 Name: Erin Barnes  MRN/ DOB: [id number], 08/27/1955    Age/ Sex: 68 y.o., female    PCP: Merri Brunette, MD   Reason for Endocrinology Evaluation: Hypothyroidism     Date of Initial Endocrinology Evaluation: 08/21/2023     HPI: Erin Barnes is a 68 y.o. female with a past medical history of hypothyroidism, [place] . The patient presented for initial endocrinology clinic visit on 08/21/2023 for consultative assistance with her Hypothyroidism.   Pt was diagnosed with hypothyroidism many years ago  No prior XRT or neck sx , but she did have left breast XRT     Pt was on NP thyroid for many years, but due to insurance issues this had to be changed to levothyroxine  The referral was placed due to concerns regarding switching to levothyroxine  So far, the patient feels well on levothyroxine and has not noticed any drastic changes She denies any local neck symptoms No palpitations  No hair loss  Denies constipation or diarrhea  Rare tremors Biotin - not taking   No Fh of thyroid disease    HISTORY:  Past Medical History:  Past Medical History:  Diagnosis Date   Allergies    Anxiety    Arthritis    Breast cancer (HCC) 01/23/2014   left insitu   Chronic pain syndrome    Depression    Diverticulosis    Estrogen deficiency    GERD (gastroesophageal reflux disease)    H pylori ulcer    Hyperlipidemia    Hypertension    Hypothyroidism    Memory change    Osteopenia    Postmenopausal HRT (hormone replacement therapy) 2011- 2015   Bio- identical HRT @ [facility]   S/P radiation therapy 04/01/2014 through 05/12/2014                                                      Left breast 4800 cGy in 24 sessions, left breast boost 1000 cGy in 5 sessions                           Sleep disorder    Wears contact lenses    Past Surgical History:  Past Surgical History:  Procedure Laterality Date   BREAST LUMPECTOMY WITH RADIOACTIVE SEED LOCALIZATION Left 02/12/2014   Procedure:  LEFT BREAST RADIOACTIVE SEED LOCALIZATION LUMPECTOMY ;  Surgeon: Chevis Pretty III, MD;  Location: [facility];  Service: General;  Laterality: Left;   COLONOSCOPY     JOINT REPLACEMENT     Right total hip Dr. Luiz Blare 08-04-17   TOTAL HIP ARTHROPLASTY Right 08/04/2017   Procedure: RIGHT TOTAL HIP ARTHROPLASTY ANTERIOR APPROACH;  Surgeon: Jodi Geralds, MD;  Location: [facility];  Service: Orthopedics;  Laterality: Right;   URETHRAL DILATION     WISDOM TOOTH EXTRACTION      Social History:  reports that she has never smoked. She has never used smokeless tobacco. She reports current alcohol use of about 20.0 standard drinks of alcohol per week. She reports that she does not use drugs. Family History: family history includes Arthritis in her mother; Dementia in her maternal grandmother and mother; Emphysema in her father; Osteoarthritis in her mother; Osteoporosis in her mother; Prostate cancer (age of onset: 72) in her father.   HOME  MEDICATIONS: Allergies as of 08/21/2023   No Known Allergies      Medication List        Accurate as of August 21, 2023 11:45 AM. If you have any questions, ask your nurse or doctor.          ALPRAZolam 0.5 MG tablet Commonly known as: XANAX Take by mouth.   aspirin EC 325 MG tablet Take 1 tablet (325 mg total) by mouth 2 (two) times daily after a meal. Take x 1 month post op to decrease risk of blood clots.   Biotin 5000 MCG Caps Take 1 capsule by mouth daily.   CALCIUM 600 PO Take 1 tablet by mouth daily.   celecoxib 200 MG capsule Commonly known as: CELEBREX Take 200 mg by mouth 2 (two) times daily.   cholecalciferol 1000 units tablet Commonly known as: VITAMIN D Take 2,000 Units by mouth daily.   cyanocobalamin 1000 MCG tablet Commonly known as: VITAMIN B12 Take 1,000 mcg by mouth daily.   docusate sodium 100 MG capsule Commonly known as: Colace Take 1 capsule (100 mg total) by mouth 2 (two) times daily.   escitalopram 20 MG  tablet Commonly known as: LEXAPRO Take 20 mg by mouth daily.   fluticasone 50 MCG/ACT nasal spray Commonly known as: FLONASE Place 1 spray into both nostrils daily.   ipratropium 0.06 % nasal spray Commonly known as: ATROVENT Place into both nostrils.   levothyroxine 50 MCG tablet Commonly known as: SYNTHROID Take 50 mcg by mouth daily.   losartan 50 MG tablet Commonly known as: COZAAR Take 50 mg by mouth daily.   Milk Thistle 200 MG Caps Take 200 mg by mouth 3 (three) times daily.   nebivolol 10 MG tablet Commonly known as: BYSTOLIC Take 10 mg by mouth daily.   omeprazole 40 MG capsule Commonly known as: PRILOSEC Take 40 mg by mouth daily.   predniSONE 50 MG tablet Commonly known as: DELTASONE Take 50 mg by mouth daily.   Prevnar 20 0.5 ML injection Generic drug: pneumococcal 20-valent conjugate vaccine   thyroid 30 MG tablet Commonly known as: ARMOUR Take 30 mg by mouth 2 (two) times daily.   traMADol 50 MG tablet Commonly known as: ULTRAM Take 50 mg by mouth every 6 (six) hours as needed for moderate pain or severe pain.   zaleplon 10 MG capsule Commonly known as: SONATA Take 10 mg by mouth at bedtime as needed for sleep.          REVIEW OF SYSTEMS: A comprehensive ROS was conducted with the patient and is negative except as per HPI    OBJECTIVE:  VS: BP 122/70 (BP Location: Left Arm, Patient Position: Sitting, Cuff Size: Normal)   Pulse 60   Ht 5\' 3"  (1.6 m)   Wt 141 lb (64 kg)   LMP 12/28/2009   SpO2 99%   BMI 24.98 kg/m    Wt Readings from Last 3 Encounters:  08/21/23 141 lb (64 kg)  08/04/17 181 lb (82.1 kg)  08/01/17 181 lb (82.1 kg)     EXAM: General: Pt appears well and is in NAD  Neck: General: Supple without adenopathy. Thyroid:   No goiter or nodules appreciated.  Lungs: Clear with good BS bilat   Heart: Auscultation: RRR.  Abdomen: Soft, nontender  Extremities:  BL LE: No pretibial edema   Mental Status: Judgment,  insight: Intact Orientation: Oriented to time, place, and person Mood and affect: No depression, anxiety, or agitation  DATA REVIEWED: ***    ASSESSMENT/PLAN/RECOMMENDATIONS:   Hypothyroidism:  -Patient is clinically euthyroid -- Pt educated extensively on the correct way to take levothyroxine (first thing in the morning with water, 30 minutes before eating or taking other medications). - Pt encouraged to double dose the following day if she were to miss a dose given long half-life of levothyroxine.   Medications : Levothyroxine 50 mcg daily    Signed electronically by: Lyndle Herrlich, MD  [facility] Endocrinology  [facility] [address] [place] [place], [place] [address] Phone: [phone] FAX: [phone]   CC: Merri Brunette, MD [address] [place] [address] Phone: [phone] Fax: [phone]   Return to Endocrinology clinic as below: Future Appointments  Date Time Provider Department Center  08/21/2023 11:50 AM Benz Vandenberghe, Konrad Dolores, MD [facility] None  08/23/2023  2:45 PM Runell Gess, MD [facility] None

## 2023-08-21 ENCOUNTER — Ambulatory Visit: Payer: Managed Care, Other (non HMO) | Admitting: Internal Medicine

## 2023-08-21 ENCOUNTER — Encounter: Payer: Self-pay | Admitting: Internal Medicine

## 2023-08-21 VITALS — BP 122/70 | HR 60 | Ht 63.0 in | Wt 141.0 lb

## 2023-08-21 DIAGNOSIS — E039 Hypothyroidism, unspecified: Secondary | ICD-10-CM

## 2023-08-21 NOTE — Patient Instructions (Signed)

## 2023-08-22 ENCOUNTER — Encounter: Payer: Self-pay | Admitting: Internal Medicine

## 2023-08-22 LAB — TSH: TSH: 1.79 m[IU]/L (ref 0.40–4.50)

## 2023-08-22 LAB — T4, FREE: Free T4: 1.2 ng/dL (ref 0.8–1.8)

## 2023-08-23 ENCOUNTER — Ambulatory Visit: Payer: Managed Care, Other (non HMO) | Attending: Cardiovascular Disease | Admitting: Cardiovascular Disease

## 2023-08-23 ENCOUNTER — Encounter: Payer: Self-pay | Admitting: Cardiovascular Disease

## 2023-08-23 VITALS — BP 106/80 | HR 65 | Ht 64.0 in | Wt 140.8 lb

## 2023-08-23 DIAGNOSIS — R9431 Abnormal electrocardiogram [ECG] [EKG]: Secondary | ICD-10-CM

## 2023-08-23 DIAGNOSIS — E782 Mixed hyperlipidemia: Secondary | ICD-10-CM | POA: Diagnosis not present

## 2023-08-23 DIAGNOSIS — E785 Hyperlipidemia, unspecified: Secondary | ICD-10-CM | POA: Insufficient documentation

## 2023-08-23 DIAGNOSIS — I10 Essential (primary) hypertension: Secondary | ICD-10-CM | POA: Insufficient documentation

## 2023-08-23 NOTE — Assessment & Plan Note (Signed)
 History of mild hyperlipidemia cholesterol performed 11/24/2022 revealing total cholesterol 206, LDL 117 and HDL of 63.  Given her lack of symptoms she is close to threshold for primary prevention and does not wish to be on a statin drug.

## 2023-08-23 NOTE — Progress Notes (Signed)
 08/23/2023 Erin Barnes   [birth year]  [id number]  Primary Physician Merri Brunette, MD Primary Cardiologist: Runell Gess MD Nicholes Calamity, [place]  HPI:  Erin Barnes is a 68 y.o. thin and fit appearing single Caucasian female with no children who recently retired from being in Airline pilot for [employer].  She was referred by her PCP, Dr. Merri Brunette, for an abnormal EKG.  Her only risk factors include treated hypertension, untreated hyperlipidemia.  She is never smoked.  There is no family history of heart disease.  She never had a heart attack or stroke.  She is fairly active, walks her dog without chest pain or shortness of breath.  Her EKG which I reviewed was essentially normal.   Current Meds  Medication Sig   ALPRAZolam (XANAX) 0.5 MG tablet Take by mouth.   cholecalciferol (VITAMIN D) 1000 units tablet Take 2,000 Units by mouth daily.   escitalopram (LEXAPRO) 20 MG tablet Take 20 mg by mouth daily.   fluticasone (FLONASE) 50 MCG/ACT nasal spray Place 1 spray into both nostrils daily.   ipratropium (ATROVENT) 0.06 % nasal spray Place into both nostrils.   levothyroxine (SYNTHROID) 50 MCG tablet Take 50 mcg by mouth daily.   losartan (COZAAR) 50 MG tablet Take 50 mg by mouth daily.   nebivolol (BYSTOLIC) 10 MG tablet Take 10 mg by mouth daily.   omeprazole (PRILOSEC) 40 MG capsule Take 40 mg by mouth daily.   traMADol (ULTRAM) 50 MG tablet Take 50 mg by mouth every 6 (six) hours as needed for moderate pain or severe pain.    vitamin B-12 (CYANOCOBALAMIN) 1000 MCG tablet Take 1,000 mcg by mouth daily.   zaleplon (SONATA) 10 MG capsule Take 10 mg by mouth at bedtime as needed for sleep.   [DISCONTINUED] aspirin EC 325 MG tablet Take 1 tablet (325 mg total) by mouth 2 (two) times daily after a meal. Take x 1 month post op to decrease risk of blood clots.   [DISCONTINUED] Biotin 5000 MCG CAPS Take 1 capsule by mouth daily.   [DISCONTINUED] Calcium Carbonate (CALCIUM 600 PO) Take 1  tablet by mouth daily.   [DISCONTINUED] celecoxib (CELEBREX) 200 MG capsule Take 200 mg by mouth 2 (two) times daily.   [DISCONTINUED] docusate sodium (COLACE) 100 MG capsule Take 1 capsule (100 mg total) by mouth 2 (two) times daily.   [DISCONTINUED] Milk Thistle 200 MG CAPS Take 200 mg by mouth 3 (three) times daily.    [DISCONTINUED] predniSONE (DELTASONE) 50 MG tablet Take 50 mg by mouth daily.   [DISCONTINUED] PREVNAR 20 0.5 ML injection      Allergies  Allergen Reactions   Tetracyclines & Related     Social History   Socioeconomic History   Marital status: Single    Spouse name: Not on file   Number of children: Not on file   Years of education: Not on file   Highest education level: Not on file  Occupational History   Not on file  Tobacco Use   Smoking status: Never   Smokeless tobacco: Never  Vaping Use   Vaping status: Never Used  Substance and Sexual Activity   Alcohol use: Yes    Alcohol/week: 20.0 standard drinks of alcohol    Types: 20 Cans of beer per week   Drug use: No   Sexual activity: Not on file    Comment: menarche age 22, P0  Other Topics Concern   Not on file  Social History Narrative  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  She does travel with her job.   Trina Ao   Social Drivers of Health   Financial Resource Strain: Not on file  Food Insecurity: Not on file  Transportation Needs: Not on file  Physical Activity: Not on file  Stress: Not on file (07/11/2019)  Social Connections: Not on file  Intimate Partner Violence: Low Risk  (09/10/2019)   Received from [facility]   Intimate Partner Violence    Insults You: Not on file    Threatens You: Not on file    Screams at You: Not on file    Physically Hurt: Not on file    Intimate Partner Violence Score: Not on file     Review of Systems: General: negative for chills, fever, night sweats or weight changes.  Cardiovascular: negative for chest pain, dyspnea on exertion,  edema, orthopnea, palpitations, paroxysmal nocturnal dyspnea or shortness of breath Dermatological: negative for rash Respiratory: negative for cough or wheezing Urologic: negative for hematuria Abdominal: negative for nausea, vomiting, diarrhea, bright red blood per rectum, melena, or hematemesis Neurologic: negative for visual changes, syncope, or dizziness All other systems reviewed and are otherwise negative except as noted above.    Blood pressure 106/80, pulse 65, height 5\' 4"  (1.626 m), weight 140 lb 12.8 oz (63.9 kg), last menstrual period 12/28/2009, SpO2 95%.  General appearance: alert and no distress Neck: no adenopathy, no carotid bruit, no JVD, supple, symmetrical, trachea midline, and thyroid not enlarged, symmetric, no tenderness/mass/nodules Lungs: clear to auscultation bilaterally Heart: regular rate and rhythm, S1, S2 normal, no murmur, click, rub or gallop Extremities: extremities normal, atraumatic, no cyanosis or edema Pulses: 2+ and symmetric Skin: Skin color, texture, turgor normal. No rashes or lesions Neurologic: Grossly normal  EKG EKG Interpretation Date/Time:  Wednesday August 23 2023 14:56:55 EDT Ventricular Rate:  65 PR Interval:  184 QRS Duration:  92 QT Interval:  432 QTC Calculation: 449 R Axis:   55  Text Interpretation: Normal sinus rhythm Normal ECG When compared with ECG of 01-Aug-2017 15:08, Nonspecific T wave abnormality no longer evident in Anterior leads Confirmed by Nanetta Batty [phone]) on 08/23/2023 3:09:45 PM    ASSESSMENT AND PLAN:   Hyperlipidemia History of mild hyperlipidemia cholesterol performed 11/24/2022 revealing total cholesterol 206, LDL 117 and HDL of 63.  Given her lack of symptoms she is close to threshold for primary prevention and does not wish to be on a statin drug.     Runell Gess MD FACP,FACC,FAHA, [facility] 08/23/2023 3:20 PM

## 2023-08-23 NOTE — Assessment & Plan Note (Signed)
 History of essential hypertension her blood pressure measured today at 106/80.  She is on losartan and Bystolic.

## 2023-08-23 NOTE — Patient Instructions (Signed)
 Medication Instructions:  Your physician recommends that you continue on your current medications as directed. Please refer to the Current Medication list given to you today.  *If you need a refill on your cardiac medications before your next appointment, please call your pharmacy*    Follow-Up: At [facility], you and your health needs are our priority.  As part of our continuing mission to provide you with exceptional heart care, we have created designated Provider Care Teams.  These Care Teams include your primary Cardiologist (physician) and Advanced Practice Providers (APPs -  Physician Assistants and Nurse Practitioners) who all work together to provide you with the care you need, when you need it.  We recommend signing up for the patient portal called "MyChart".  Sign up information is provided on this After Visit Summary.  MyChart is used to connect with patients for Virtual Visits (Telemedicine).  Patients are able to view lab/test results, encounter notes, upcoming appointments, etc.  Non-urgent messages can be sent to your provider as well.   To learn more about what you can do with MyChart, go to [email or web address].    Your next appointment:   We will see you on an as needed basis.  Provider:   Nanetta Batty, MD    Other Instructions   1st Floor: - Lobby - Registration  - Pharmacy  - Lab - Cafe  2nd Floor: - PV Lab - Diagnostic Testing (echo, CT, nuclear med)  3rd Floor: - Vacant  4th Floor: - TCTS (cardiothoracic surgery) - AFib Clinic - Structural Heart Clinic - Vascular Surgery  - Vascular Ultrasound  5th Floor: - HeartCare Cardiology (general and EP) - Clinical Pharmacy for coumadin, hypertension, lipid, weight-loss medications, and med management appointments    Valet parking services will be available as well.

## 2023-08-31 DIAGNOSIS — H5213 Myopia, bilateral: Secondary | ICD-10-CM | POA: Diagnosis not present

## 2023-09-28 DIAGNOSIS — I10 Essential (primary) hypertension: Secondary | ICD-10-CM | POA: Diagnosis not present

## 2023-11-16 DIAGNOSIS — Z0100 Encounter for examination of eyes and vision without abnormal findings: Secondary | ICD-10-CM | POA: Diagnosis not present

## 2024-01-05 DIAGNOSIS — M47816 Spondylosis without myelopathy or radiculopathy, lumbar region: Secondary | ICD-10-CM | POA: Diagnosis not present

## 2024-01-10 DIAGNOSIS — M47816 Spondylosis without myelopathy or radiculopathy, lumbar region: Secondary | ICD-10-CM | POA: Diagnosis not present

## 2024-04-05 DIAGNOSIS — H53143 Visual discomfort, bilateral: Secondary | ICD-10-CM | POA: Diagnosis not present

## 2024-04-05 DIAGNOSIS — H04123 Dry eye syndrome of bilateral lacrimal glands: Secondary | ICD-10-CM | POA: Diagnosis not present

## 2024-04-08 DIAGNOSIS — M47816 Spondylosis without myelopathy or radiculopathy, lumbar region: Secondary | ICD-10-CM | POA: Diagnosis not present

## 2024-04-11 DIAGNOSIS — I10 Essential (primary) hypertension: Secondary | ICD-10-CM | POA: Diagnosis not present

## 2024-04-11 DIAGNOSIS — Z124 Encounter for screening for malignant neoplasm of cervix: Secondary | ICD-10-CM | POA: Diagnosis not present

## 2024-04-11 DIAGNOSIS — E785 Hyperlipidemia, unspecified: Secondary | ICD-10-CM | POA: Diagnosis not present

## 2024-04-11 DIAGNOSIS — E039 Hypothyroidism, unspecified: Secondary | ICD-10-CM | POA: Diagnosis not present

## 2024-05-07 DIAGNOSIS — M47816 Spondylosis without myelopathy or radiculopathy, lumbar region: Secondary | ICD-10-CM | POA: Diagnosis not present
